# Patient Record
Sex: Male | Born: 1950 | Race: White | Hispanic: No | Marital: Married | Smoking: Current every day smoker
Health system: Southern US, Community
[De-identification: ages and names within clinical notes are randomized; demographics above are authoritative.]

## PROBLEM LIST (undated history)

## (undated) DIAGNOSIS — M1990 Unspecified osteoarthritis, unspecified site: Secondary | ICD-10-CM

## (undated) DIAGNOSIS — K5792 Diverticulitis of intestine, part unspecified, without perforation or abscess without bleeding: Secondary | ICD-10-CM

## (undated) DIAGNOSIS — D490 Neoplasm of unspecified behavior of digestive system: Secondary | ICD-10-CM

## (undated) HISTORY — PX: TONSILLECTOMY: SUR1361

## (undated) HISTORY — DX: Diverticulitis of intestine, part unspecified, without perforation or abscess without bleeding: K57.92

## (undated) HISTORY — PX: CERVICAL SPINE SURGERY: SHX589

## (undated) HISTORY — DX: Neoplasm of unspecified behavior of digestive system: D49.0

---

## 2004-09-27 ENCOUNTER — Ambulatory Visit (HOSPITAL_COMMUNITY): Admission: RE | Admit: 2004-09-27 | Discharge: 2004-09-27 | Payer: Self-pay | Admitting: Neurosurgery

## 2004-11-05 ENCOUNTER — Encounter: Admission: RE | Admit: 2004-11-05 | Discharge: 2004-11-05 | Payer: Self-pay | Admitting: Neurosurgery

## 2010-02-01 ENCOUNTER — Encounter
Admission: RE | Admit: 2010-02-01 | Discharge: 2010-02-01 | Payer: Self-pay | Source: Home / Self Care | Attending: Emergency Medicine | Admitting: Emergency Medicine

## 2010-05-31 NOTE — Op Note (Signed)
NAMEDEQUINCY, BORN                 ACCOUNT NO.:  [id number]   MEDICAL RECORD NO.:  [id number]          PATIENT TYPE:  AMB   LOCATION:  SDS                          FACILITY:  [facility]   PHYSICIAN:  Donalee Citrin, M.D.        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   OPERATION/PROCEDURE:  Anterior cervical diskectomy and fusion of C5-6 and C6-  7 using 5 mm LifeNet patellar wedges and a 40 mm Atlantis Vision plate with  six 13-mm variable angle screws.   SURGEON:  Donalee Citrin, M.D.   ASSISTANT:  Thayer Ohm.   ANESTHESIA:  General endotracheal anesthesia.   HISTORY OF PRESENT ILLNESS:  The patient is a very pleasant 60 year old  gentleman who has a longstanding neck and right arm pain with pain that  radiates from his neck down the back of his right arm to his thumb and  forefinger as well as occasionally into his middle finger where he had  preoperative weakness of his triceps, and preoperative imaging showed severe  spinal stenosis at C5-6 and C6-7 with spondylytic compression of both C6 and  C7 nerve roots.  The patient also had co-existing carpal tunnel confirmed by  EMG with severe median nerve entrapment.  The patient failed all forms of  conservative treatment and was recommended anterior cervical diskectomy and  fusion, to be followed by carpal tunnel release.  This is the operative note  for the anterior cervical diskectomy and fusion.   Risks and benefits explained to patient.  He understood and agreed to  proceed forward.   DESCRIPTION OF PROCEDURE:  The patient was brought to the OR where he  received general anesthesia, in the supine position, neck flexion and  extension, five pounds of halter traction.  The  right side of his neck was  prepped and draped in the usual sterile fashion.  We localized the C5-6 disk  space.  A curvilinear incision was made just off the midline and extended to  the sternocleidomastoid.  Then a superficial layer of platysma was dissected  out and divided longitudinally.  The avascular plane of the  sternocleidomastoid and the strap muscle was developed down to prevertebral  fascia.  The prevertebral fascia was dissected with Kitners.  Intraoperative  x-ray confirmed localization of the C5-6 disk space.  Annulotomy was made  with an 11 blade scalpel, marked the disk space and the longus coli was  reflected laterally with Bovie electrocautery and bipolar electrocautery.  Then both disk spaces were identified and incised.  Self-retaining retractor  was placed.  Annulotomies were extended with the 15-blade scalpel. There was  noted to be marked osteophytosis in the front anterior margin to C5-6 and C6-  7 disk space.  These were all bitten off with the Leksell rongeur and 2 and  3-mm Kerrison punch.  Then both interspaces were drilled down to posterior  annulus and posterior osteophytic complex.  At this point to operating  microscope was draped and brought into the field under microscopic  illumination.  The C5-6 disk space was drilled down.  There was noted to be  a very large osteophyte coming off the C5 vertebral body, compressing the  thecal sac and spinal cord at this level.  Using a combination of a 1-mm  Kerrison punch and high-speed drill, this osteophyte was drilled down and  bitten off, decompressing the central canal, and the posterior osteophytic  ligament was identified and removed in piecemeal fashion, identifying the  thecal sac.  Then the large osteophyte coming off the C5 as well as C6  vertebral bodies were underbitten and decompressing both proximal C6 neural  foramina. The right side C6 nerve root was mildly decompressed at the end of  diskectomy as  well as the left and there was no further stenosis.  __________ bone graft and Gelfoam was placed.   Then at C6-7 the procedure was repeated.  Again a large osteophyte coming  off the C6 and C7 vertebral bodies compressing the right paramedian aspect  of the spinal cord thecal sac.  This was all underbitten with a 2-mm  Kerrison punch.  Posterior osteophytic ligament was removed in piecemeal  fashion.  Thecal sac was immediately visualized and this large osteophyte  causing severe spinal stenosis at this level was underbitten until the  thecal sac and dura bulged back up into the diskectomy defect in anatomic  position.  Then both the C7 neural foramina were decompressed proximally and  the wound was copiously irrigated, again placing __________ bone graft.  Two  5 mm wedges were inserted, 1-mm deep to the anterior vertebral body line.  A  40-mm Atlantis plate was sized, selected and inserted.  Six 13-mm variable  angle screws were drill tapped in place.  All screws had excellent purchase.  Set screws tightened down.  The wound was then copiously irrigated.  Meticulous hemostasis was maintained.  The platysma was reapproximated with  3-0 interrupted Vicryl and the skin was closed with running 4-0  subcuticular.  Benzoin and Steri-Strips applied.  The patient went to the  recovery room in stable condition.           ______________________________  Donalee Citrin, M.D.     GC/MEDQ  D:  09/27/2004  T:  09/27/2004  Job:  [id number]

## 2010-05-31 NOTE — Op Note (Signed)
Roger Mcdonald, Roger Mcdonald                 ACCOUNT NO.:  [id number]   MEDICAL RECORD NO.:  [id number]          PATIENT TYPE:  AMB   LOCATION:  SDS                          FACILITY:  [facility]   PHYSICIAN:  Donalee Citrin, M.D.        DATE OF BIRTH:  March 24, 1950   DATE OF PROCEDURE:  09/27/2004  DATE OF DISCHARGE:                                 OPERATIVE REPORT   PREOPERATIVE DIAGNOSIS:  Right carpal tunnel syndrome.   PROCEDURE:  Right carpal tunnel release.   SURGEON:  Donalee Citrin, M.D.   ANESTHESIA:  General endotracheal.   INDICATIONS FOR PROCEDURE:  The patient is a very pleasant 60 year old  gentleman who had both neck and right arm pain, as well as severe right  carpal tunnel syndrome documented by EMG.  The patient had undergone an  anterior cervical diskectomy fusion by me prior to turning the table and  undergoing a carpal tunnel release.  This was dictated in the previous  operative note.   DESCRIPTION OF PROCEDURE:  At the completion of the anterior cervical  diskectomy, the bed was turned and the right hand was prepped and draped in  the usual sterile fashion.  An incision was drawn out along the palmar  crease proximally 3 to 4 cm long, into the distal wrist crease and was  incised with a #15 blade scalpel.  Then using sharp dissection with the #15  blade scalpel, the palmar fascia was dissected away, exposing the transverse  carpal ligament which was incised, noting marked hypertrophy. The under-  surface of the transverse carpal ligament was freed up with a mosquito  hemostat.  Then using a mosquito to develop the plane between the transverse  carpal ligament and the median nerve, the transverse carpal ligament was  divided.  This was divided distally, freeing up the distal median nerve as  well as proximally up towards the wrist and proximal forearm.  This was  divided there as well, and completely freeing up the median nerve.  At the  end of the division of the ligament, the  median nerve was free and clear and  explored with a hemostat.  The wound was then copiously irrigated.  Meticulous hemostasis was maintained.  Interrupted vertical mattress suture  was then placed in the skin and the hand was dressed with Bacitracin,  Adaptic, fluffs and an Ace wrap.           ______________________________  Donalee Citrin, M.D.     GC/MEDQ  D:  09/27/2004  T:  09/27/2004  Job:  [id number]

## 2012-02-04 IMAGING — CR DG HAND COMPLETE 3+V*R*
3 series · 3 of 3 positions shown · non-contrast
Comparison: None

CLINICAL DATA: right hand third metacarpal tenderness and swelling

RIGHT HAND - COMPLETE 3+ VIEW

[view not recorded (1 of 3)]
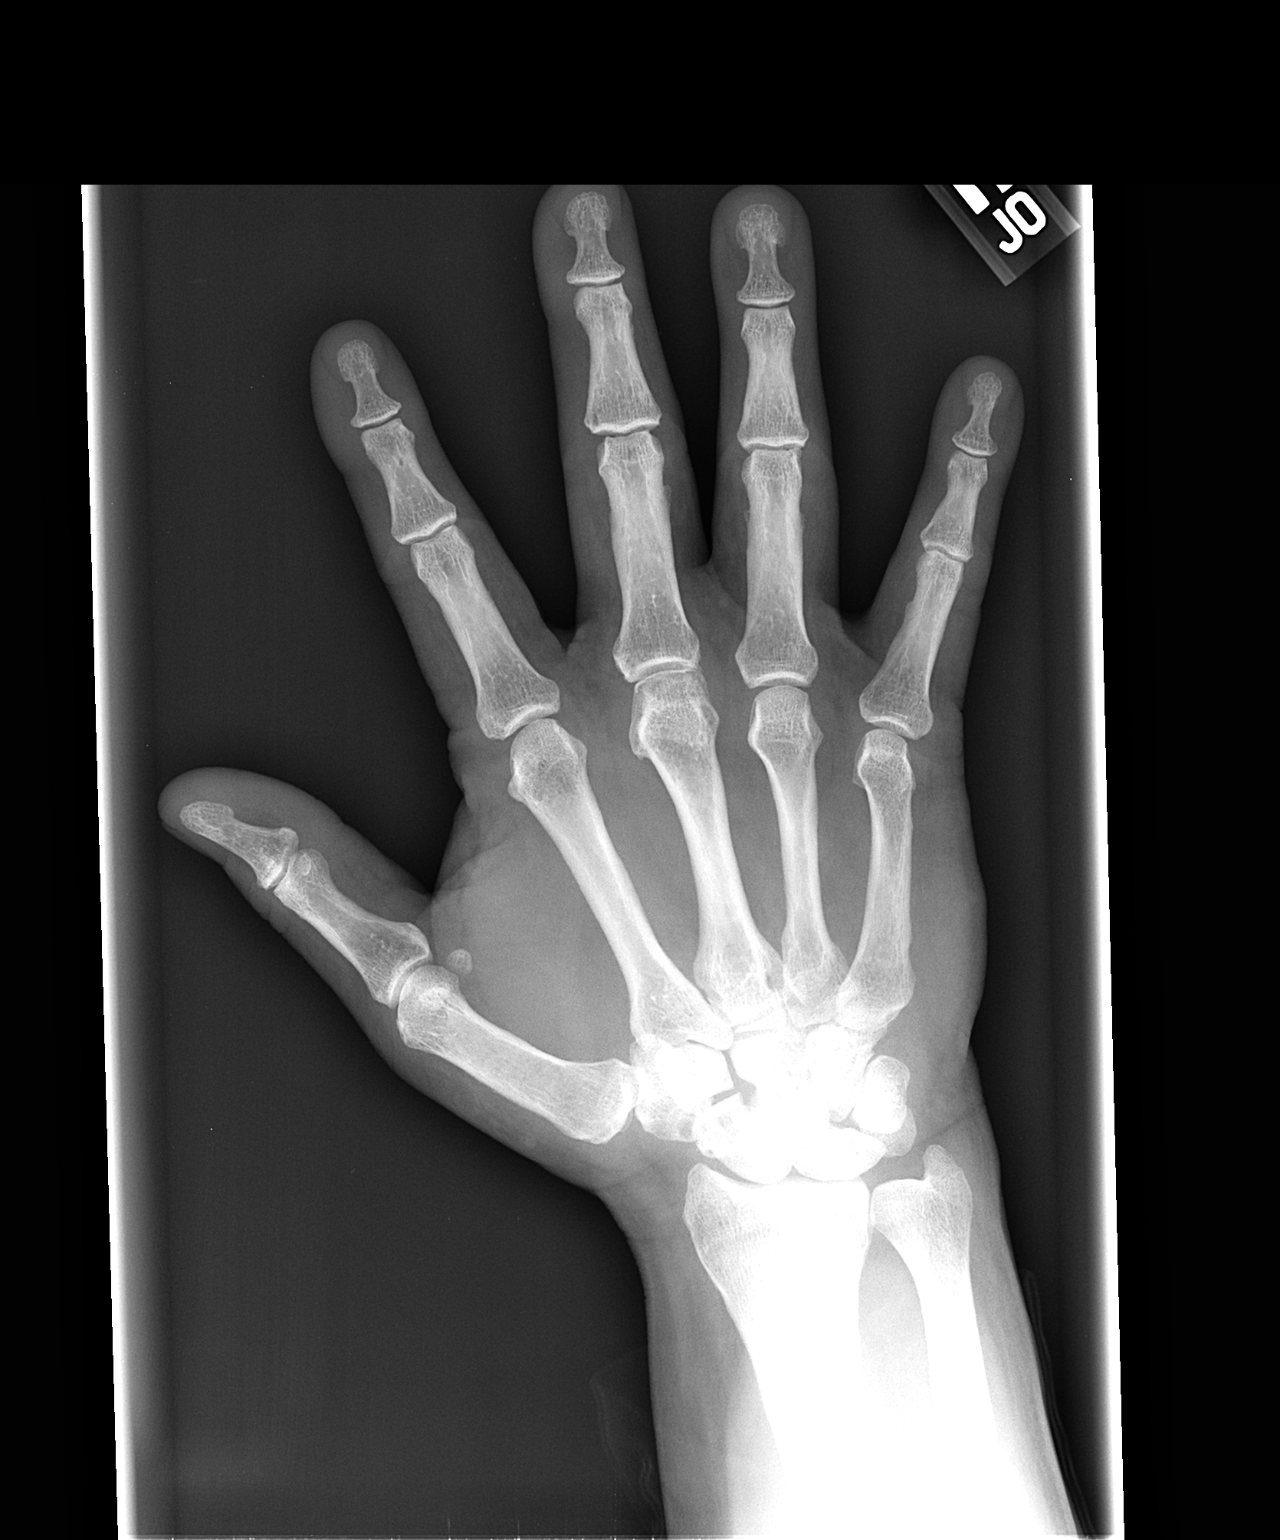

[view not recorded (2 of 3)]
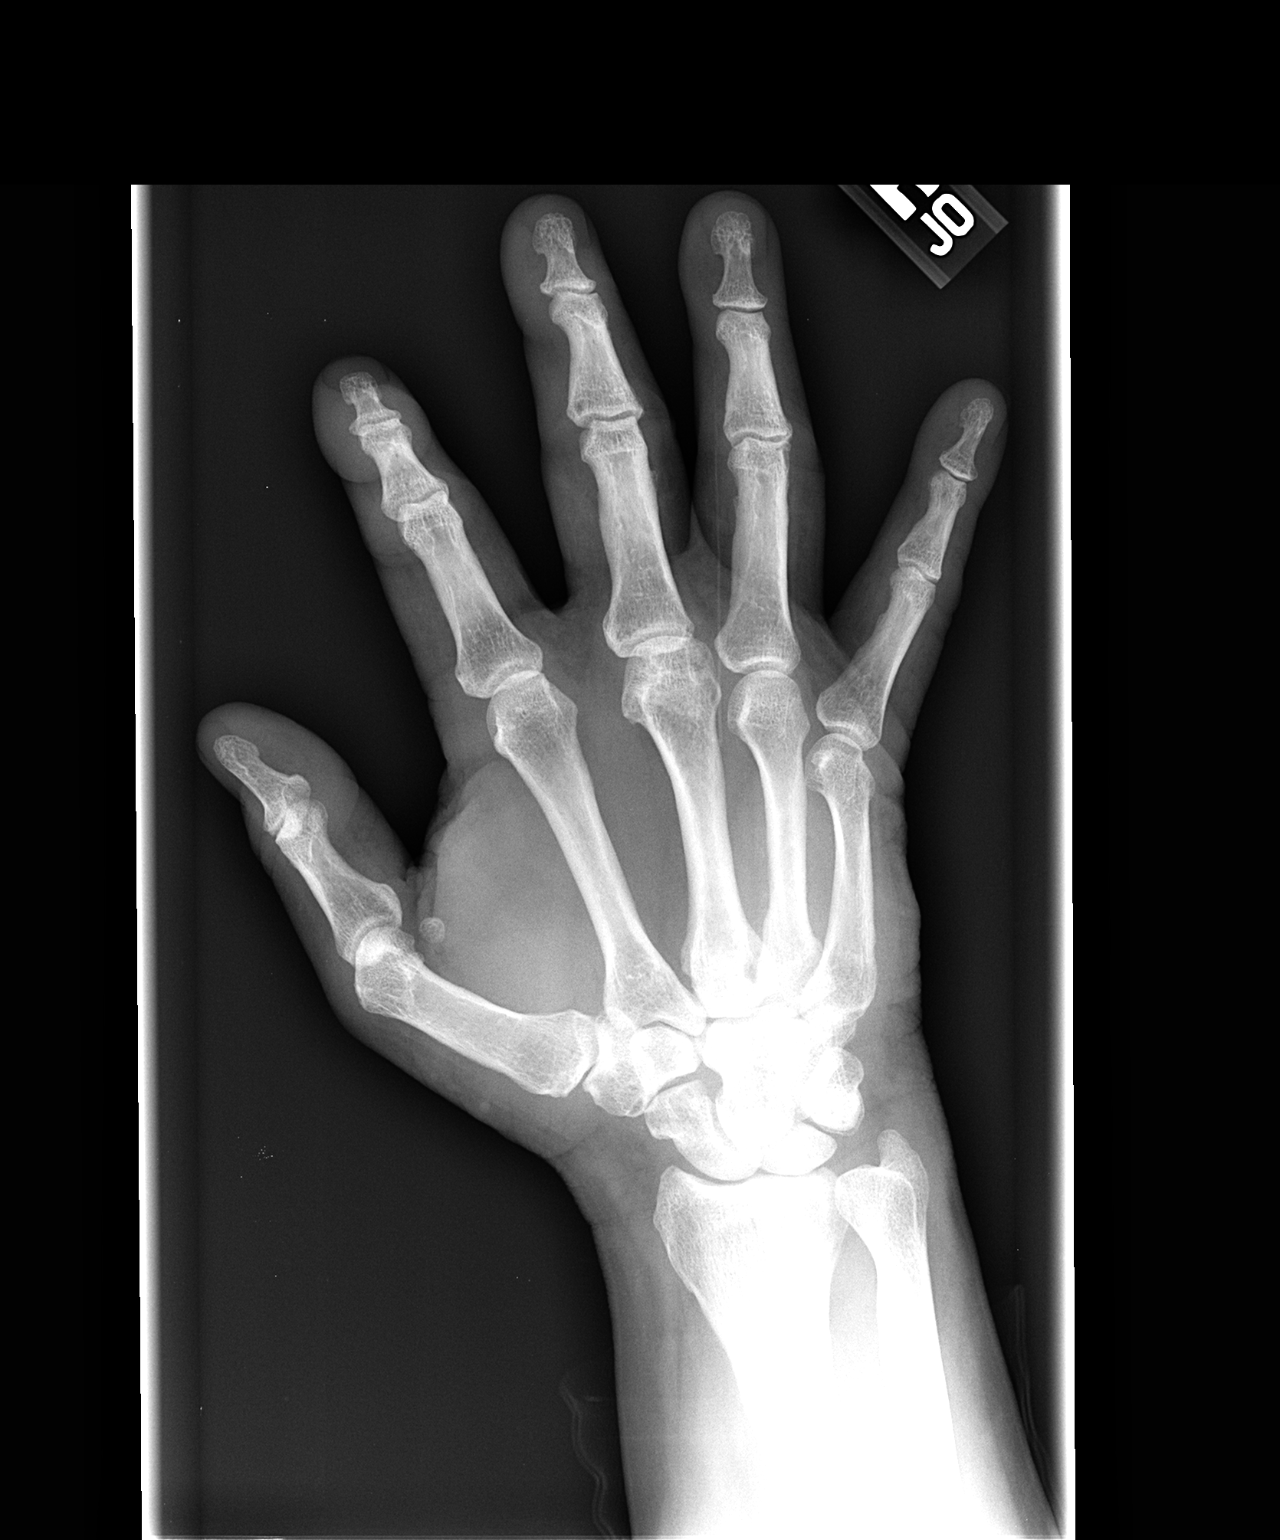

[view not recorded (3 of 3)]
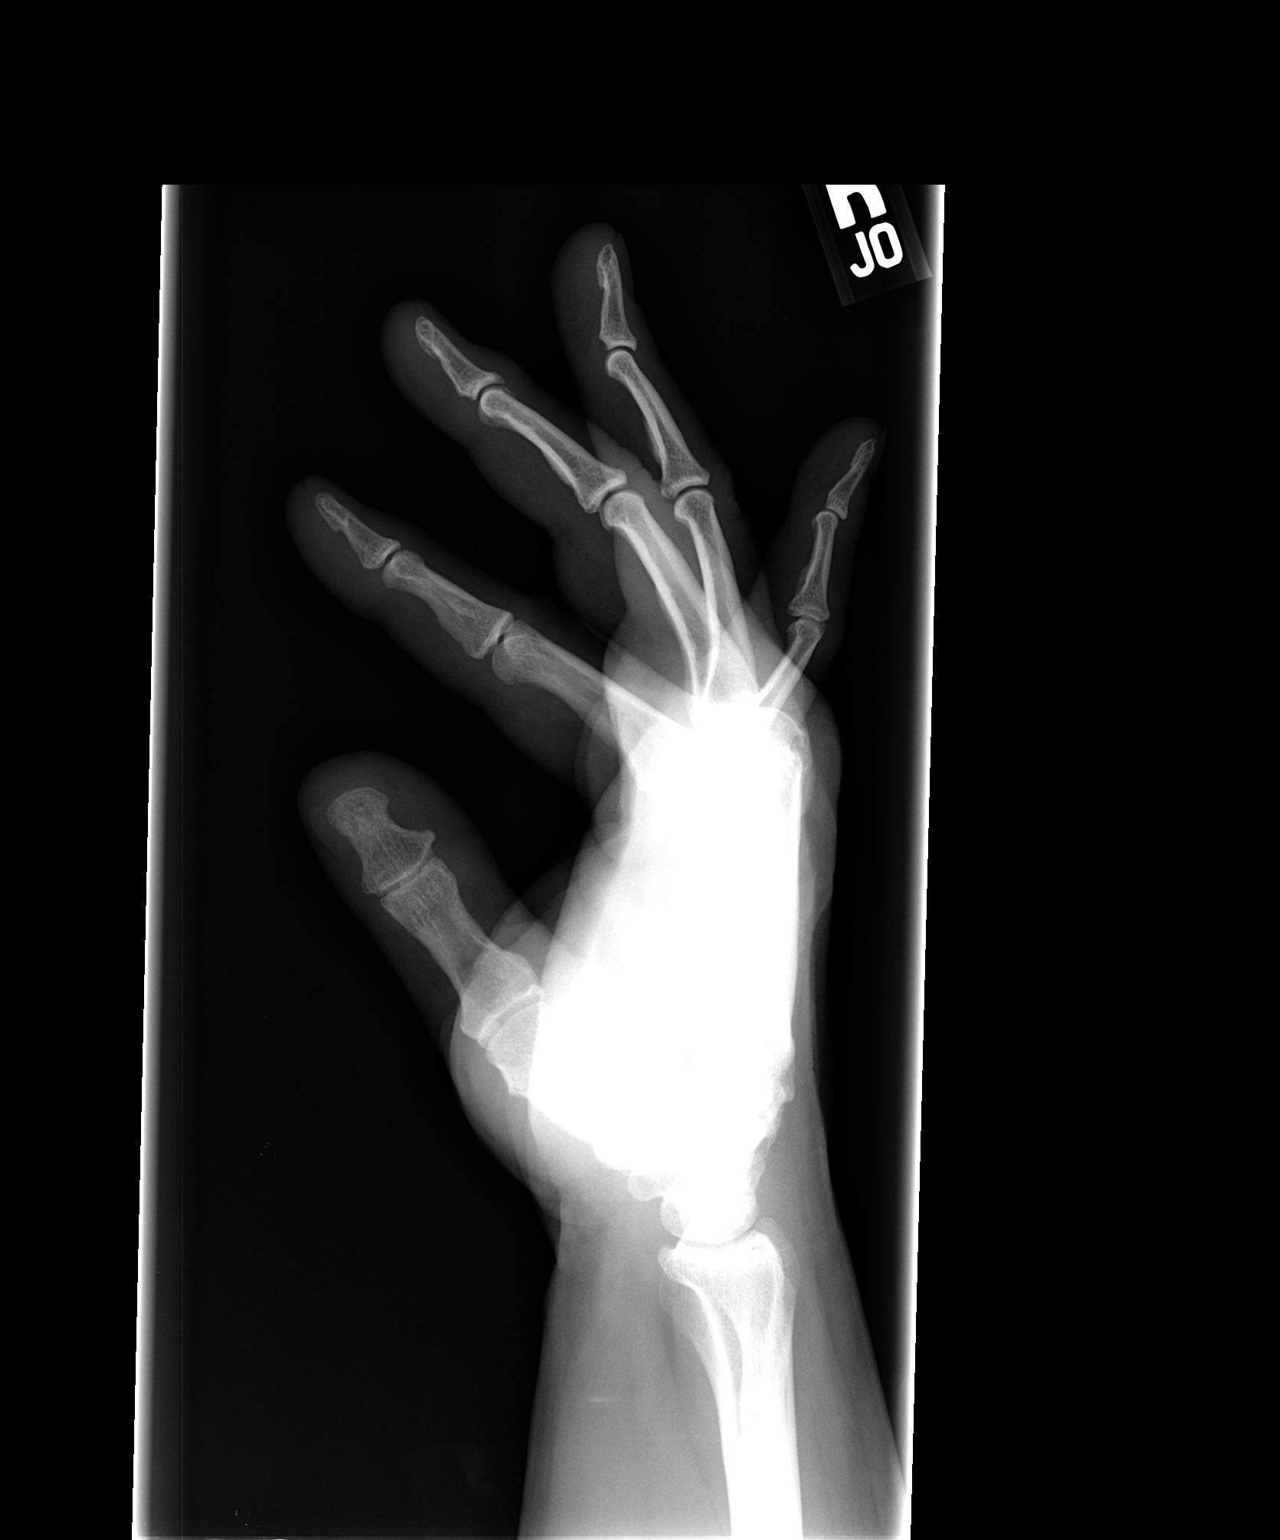

[3 of 3 positions shown; findings below may reference images not displayed]

FINDINGS: There is no evidence of fracture or dislocation.  There
is no evidence of arthropathy or other focal bone abnormality.
Soft tissues are unremarkable.
IMPRESSION: No focal bony abnormalities noted.

## 2022-06-08 ENCOUNTER — Encounter (HOSPITAL_BASED_OUTPATIENT_CLINIC_OR_DEPARTMENT_OTHER): Payer: Self-pay | Admitting: Emergency Medicine

## 2022-06-08 ENCOUNTER — Inpatient Hospital Stay (HOSPITAL_BASED_OUTPATIENT_CLINIC_OR_DEPARTMENT_OTHER)
Admission: EM | Admit: 2022-06-08 | Discharge: 2022-06-13 | DRG: 331 | Disposition: A | Discharge status: Home Health | Payer: Medicare Other | Attending: Internal Medicine | Admitting: Internal Medicine

## 2022-06-08 ENCOUNTER — Other Ambulatory Visit: Payer: Self-pay

## 2022-06-08 ENCOUNTER — Emergency Department (HOSPITAL_BASED_OUTPATIENT_CLINIC_OR_DEPARTMENT_OTHER): Payer: Medicare Other

## 2022-06-08 DIAGNOSIS — K56609 Unspecified intestinal obstruction, unspecified as to partial versus complete obstruction: Secondary | ICD-10-CM

## 2022-06-08 DIAGNOSIS — I700 Atherosclerosis of aorta: Secondary | ICD-10-CM | POA: Diagnosis present

## 2022-06-08 DIAGNOSIS — Z6834 Body mass index (BMI) 34.0-34.9, adult: Secondary | ICD-10-CM | POA: Diagnosis not present

## 2022-06-08 DIAGNOSIS — K5720 Diverticulitis of large intestine with perforation and abscess without bleeding: Secondary | ICD-10-CM | POA: Diagnosis present

## 2022-06-08 DIAGNOSIS — Z66 Do not resuscitate: Secondary | ICD-10-CM | POA: Diagnosis present

## 2022-06-08 DIAGNOSIS — R109 Unspecified abdominal pain: Secondary | ICD-10-CM | POA: Diagnosis present

## 2022-06-08 DIAGNOSIS — C187 Malignant neoplasm of sigmoid colon: Secondary | ICD-10-CM | POA: Diagnosis not present

## 2022-06-08 DIAGNOSIS — F17210 Nicotine dependence, cigarettes, uncomplicated: Secondary | ICD-10-CM | POA: Diagnosis present

## 2022-06-08 DIAGNOSIS — K6389 Other specified diseases of intestine: Principal | ICD-10-CM

## 2022-06-08 DIAGNOSIS — D72829 Elevated white blood cell count, unspecified: Secondary | ICD-10-CM | POA: Diagnosis not present

## 2022-06-08 DIAGNOSIS — K56601 Complete intestinal obstruction, unspecified as to cause: Secondary | ICD-10-CM

## 2022-06-08 DIAGNOSIS — E876 Hypokalemia: Secondary | ICD-10-CM | POA: Diagnosis not present

## 2022-06-08 LAB — URINALYSIS, ROUTINE W REFLEX MICROSCOPIC
Glucose, UA: NEGATIVE mg/dL
Hgb urine dipstick: NEGATIVE
Ketones, ur: NEGATIVE mg/dL
Leukocytes,Ua: NEGATIVE
Nitrite: NEGATIVE
Protein, ur: 30 mg/dL — AB
Specific Gravity, Urine: >=1.03 (ref 1.005–1.030)
pH: 5.5 (ref 5.0–8.0)

## 2022-06-08 LAB — CBC WITH DIFFERENTIAL/PLATELET
Abs Immature Granulocytes: 0.04 10*3/uL (ref 0.00–0.07)
Basophils Absolute: 0.1 10*3/uL (ref 0.0–0.1)
Basophils Relative: 1 %
Eosinophils Absolute: 0.4 10*3/uL (ref 0.0–0.5)
Eosinophils Relative: 4 %
HCT: 44.9 % (ref 39.0–52.0)
Hemoglobin: 15 g/dL (ref 13.0–17.0)
Immature Granulocytes: 0 %
Lymphocytes Relative: 14 %
Lymphs Abs: 1.6 10*3/uL (ref 0.7–4.0)
MCH: 29.4 pg (ref 26.0–34.0)
MCHC: 33.4 g/dL (ref 30.0–36.0)
MCV: 88 fL (ref 80.0–100.0)
Monocytes Absolute: 1 10*3/uL (ref 0.1–1.0)
Monocytes Relative: 9 %
Neutro Abs: 8.3 10*3/uL — ABNORMAL HIGH (ref 1.7–7.7)
Neutrophils Relative %: 72 %
Platelets: 299 10*3/uL (ref 150–400)
RBC: 5.1 MIL/uL (ref 4.22–5.81)
RDW: 13.1 % (ref 11.5–15.5)
WBC: 11.5 10*3/uL — ABNORMAL HIGH (ref 4.0–10.5)
nRBC: 0 % (ref 0.0–0.2)

## 2022-06-08 LAB — COMPREHENSIVE METABOLIC PANEL
ALT: 19 U/L (ref 0–44)
AST: 20 U/L (ref 15–41)
Albumin: 3.4 g/dL — ABNORMAL LOW (ref 3.5–5.0)
Alkaline Phosphatase: 55 U/L (ref 38–126)
Anion gap: 8 (ref 5–15)
BUN: 13 mg/dL (ref 8–23)
CO2: 23 mmol/L (ref 22–32)
Calcium: 8 mg/dL — ABNORMAL LOW (ref 8.9–10.3)
Chloride: 105 mmol/L (ref 98–111)
Creatinine, Ser: 1 mg/dL (ref 0.61–1.24)
GFR, Estimated: >60 mL/min (ref >60)
Glucose, Bld: 114 mg/dL — ABNORMAL HIGH (ref 70–99)
Potassium: 3.6 mmol/L (ref 3.5–5.1)
Sodium: 136 mmol/L (ref 135–145)
Total Bilirubin: 0.8 mg/dL (ref 0.3–1.2)
Total Protein: 6.7 g/dL (ref 6.5–8.1)

## 2022-06-08 LAB — URINALYSIS, MICROSCOPIC (REFLEX)

## 2022-06-08 LAB — LIPASE, BLOOD: Lipase: 31 U/L (ref 11–51)

## 2022-06-08 MED ORDER — NALOXONE HCL 0.4 MG/ML IJ SOLN: 0.4000 mg | INTRAMUSCULAR | Status: DC | PRN

## 2022-06-08 MED ORDER — SODIUM CHLORIDE 0.9 % IV BOLUS
1000.0000 mL | Freq: Once | INTRAVENOUS | Status: AC
  Administered 2022-06-08: 1000 mL via INTRAVENOUS

## 2022-06-08 MED ORDER — FENTANYL CITRATE PF 50 MCG/ML IJ SOSY: 50.0000 ug | PREFILLED_SYRINGE | Freq: Once | INTRAMUSCULAR | Status: DC

## 2022-06-08 MED ORDER — ACETAMINOPHEN 325 MG PO TABS: 650.0000 mg | ORAL_TABLET | Freq: Four times a day (QID) | ORAL | Status: DC | PRN

## 2022-06-08 MED ORDER — ONDANSETRON HCL 4 MG/2ML IJ SOLN
4.0000 mg | Freq: Once | INTRAMUSCULAR | Status: DC
  Filled 2022-06-08: qty 2

## 2022-06-08 MED ORDER — ACETAMINOPHEN 500 MG PO TABS
1000.0000 mg | ORAL_TABLET | Freq: Once | ORAL | Status: AC
  Administered 2022-06-08: 1000 mg via ORAL
  Filled 2022-06-08: qty 2

## 2022-06-08 MED ORDER — SODIUM CHLORIDE 0.9 % IV SOLN: INTRAVENOUS | Status: AC

## 2022-06-08 MED ORDER — ACETAMINOPHEN 650 MG RE SUPP: 650.0000 mg | Freq: Four times a day (QID) | RECTAL | Status: DC | PRN

## 2022-06-08 MED ORDER — LIDOCAINE HCL URETHRAL/MUCOSAL 2 % EX GEL
1.0000 | Freq: Once | CUTANEOUS | Status: AC
  Administered 2022-06-08: 1 via TOPICAL
  Filled 2022-06-08: qty 11

## 2022-06-08 MED ORDER — MORPHINE SULFATE (PF) 2 MG/ML IV SOLN: 0.5000 mg | INTRAVENOUS | Status: DC | PRN

## 2022-06-08 MED ORDER — IOHEXOL 300 MG/ML  SOLN
100.0000 mL | Freq: Once | INTRAMUSCULAR | Status: AC | PRN
  Administered 2022-06-08: 100 mL via INTRAVENOUS

## 2022-06-08 MED ORDER — ACETAMINOPHEN 325 MG PO TABS: 325.0000 mg | ORAL_TABLET | Freq: Once | ORAL | Status: DC

## 2022-06-08 MED ORDER — ONDANSETRON HCL 4 MG/2ML IJ SOLN: 4.0000 mg | Freq: Four times a day (QID) | INTRAMUSCULAR | Status: DC | PRN

## 2022-06-08 NOTE — ED Notes (Signed)
ED TO INPATIENT HANDOFF REPORT  ED Nurse Name and Phone #: Gorden Harms [phone]  S Name/Age/Gender Roger Mcdonald 72 y.o. male Room/Bed: MH08/MH08  Code Status   Code Status: Not on file  Home/SNF/Other Home Patient oriented to: self, place, time, and situation Is this baseline? Yes   Triage Complete: Triage complete  Chief Complaint Large bowel obstruction (HCC) [K56.609]  Triage Note Pt c/o general lower abd pain; sts he has had a lot of gas for about a month; this pain is worse; +diarrhea   Allergies No Known Allergies  Level of Care/Admitting Diagnosis ED Disposition     ED Disposition  Admit   Condition  --   Comment  Hospital [facility] [[id number]]  Level of Care: Med-Surg [16]  May admit patient to [facility] or [facility] if equivalent level of care is available:: Yes  Interfacility transfer: Yes  Covid Evaluation: Asymptomatic - no recent exposure (last 10 days) testing not required  Diagnosis: Large bowel obstruction [facility]) [[id number]]  Admitting Physician: Frankey Shown [[id number]]  Attending Physician: Frankey Shown [[id number]]  Certification:: I certify this patient will need inpatient services for at least 2 midnights  Estimated Length of Stay: 3          B Medical/Surgery History History reviewed. No pertinent past medical history. Past Surgical History:  Procedure Laterality Date   CERVICAL SPINE SURGERY     TONSILLECTOMY       A IV Location/Drains/Wounds Patient Lines/Drains/Airways Status     Active Line/Drains/Airways     Name Placement date Placement time Site Days   Peripheral IV 06/08/22 20 G Posterior;Right Hand 06/08/22  1550  Hand  less than 1   Peripheral IV 06/08/22 18 G Left Antecubital 06/08/22  1640  Antecubital  less than 1   NG/OG Vented/Dual Lumen 14 Fr. Left nare External length of tube 22 cm 06/08/22  1852  Left nare  less than 1            Intake/Output Last 24  hours  Intake/Output Summary (Last 24 hours) at 06/08/2022 2007 Last data filed at 06/08/2022 1727 Gross per 24 hour  Intake 1000 ml  Output --  Net 1000 ml    Labs/Imaging Results for orders placed or performed during the hospital encounter of 06/08/22 (from the past 48 hour(s))  CBC with Differential     Status: Abnormal   Collection Time: 06/08/22  3:56 PM  Result Value Ref Range   WBC 11.5 (H) 4.0 - 10.5 K/uL   RBC 5.10 4.22 - 5.81 MIL/uL   Hemoglobin 15.0 13.0 - 17.0 g/dL   HCT 29.5 18.8 - 41.6 %   MCV 88.0 80.0 - 100.0 fL   MCH 29.4 26.0 - 34.0 pg   MCHC 33.4 30.0 - 36.0 g/dL   RDW 60.6 30.1 - 60.1 %   Platelets 299 150 - 400 K/uL   nRBC 0.0 0.0 - 0.2 %   Neutrophils Relative % 72 %   Neutro Abs 8.3 (H) 1.7 - 7.7 K/uL   Lymphocytes Relative 14 %   Lymphs Abs 1.6 0.7 - 4.0 K/uL   Monocytes Relative 9 %   Monocytes Absolute 1.0 0.1 - 1.0 K/uL   Eosinophils Relative 4 %   Eosinophils Absolute 0.4 0.0 - 0.5 K/uL   Basophils Relative 1 %   Basophils Absolute 0.1 0.0 - 0.1 K/uL   Immature Granulocytes 0 %   Abs Immature Granulocytes 0.04 0.00 - 0.07 K/uL  Comment: Performed at [facility], [address] [place], [place] [address]  Urinalysis, Routine w reflex microscopic -Urine, Clean Catch     Status: Abnormal   Collection Time: 06/08/22  3:57 PM  Result Value Ref Range   Color, Urine YELLOW YELLOW   APPearance CLEAR CLEAR   Specific Gravity, Urine >=1.030 1.005 - 1.030   pH 5.5 5.0 - 8.0   Glucose, UA NEGATIVE NEGATIVE mg/dL   Hgb urine dipstick NEGATIVE NEGATIVE   Bilirubin Urine SMALL (A) NEGATIVE   Ketones, ur NEGATIVE NEGATIVE mg/dL   Protein, ur 30 (A) NEGATIVE mg/dL   Nitrite NEGATIVE NEGATIVE   Leukocytes,Ua NEGATIVE NEGATIVE    Comment: Performed at [facility], [address] [place], [place] [address]  Urinalysis, Microscopic (reflex)     Status: Abnormal   Collection Time: 06/08/22  3:57 PM  Result Value Ref Range   RBC /  HPF 0-5 0 - 5 RBC/hpf   WBC, UA 0-5 0 - 5 WBC/hpf   Bacteria, UA RARE (A) NONE SEEN   Squamous Epithelial / HPF 0-5 0 - 5 /HPF   Mucus PRESENT     Comment: Performed at [facility], [address] [place], [place] [address]  Comprehensive metabolic panel     Status: Abnormal   Collection Time: 06/08/22  4:40 PM  Result Value Ref Range   Sodium 136 135 - 145 mmol/L   Potassium 3.6 3.5 - 5.1 mmol/L   Chloride 105 98 - 111 mmol/L   CO2 23 22 - 32 mmol/L   Glucose, Bld 114 (H) 70 - 99 mg/dL    Comment: Glucose reference range applies only to samples taken after fasting for at least 8 hours.   BUN 13 8 - 23 mg/dL   Creatinine, Ser 9.14 0.61 - 1.24 mg/dL   Calcium 8.0 (L) 8.9 - 10.3 mg/dL   Total Protein 6.7 6.5 - 8.1 g/dL   Albumin 3.4 (L) 3.5 - 5.0 g/dL   AST 20 15 - 41 U/L   ALT 19 0 - 44 U/L   Alkaline Phosphatase 55 38 - 126 U/L   Total Bilirubin 0.8 0.3 - 1.2 mg/dL   GFR, Estimated >78 >29 mL/min    Comment: (NOTE) Calculated using the CKD-EPI Creatinine Equation (2021)    Anion gap 8 5 - 15    Comment: Performed at [facility], [address] [place], [place] [address]  Lipase, blood     Status: None   Collection Time: 06/08/22  4:40 PM  Result Value Ref Range   Lipase 31 11 - 51 U/L    Comment: Performed at [facility], [address] [place], [place] [address]   DG Abd Portable 1V  Result Date: 06/08/2022 CLINICAL DATA:  NG tube placement EXAM: PORTABLE ABDOMEN - 1 VIEW COMPARISON:  CT today FINDINGS: NG tube tip is in the proximal stomach near the GE junction. Side port is in the distal esophagus. Gaseous distention of the visualized colon. Visualized lung bases clear. IMPRESSION: NG tube tip near the GE junction with the side port in the distal esophagus. This could be advanced several cm for optimal positioning. Electronically Signed   By: Charlett Nose M.D.   On: 06/08/2022 19:21   CT ABDOMEN PELVIS W CONTRAST  Result Date:  06/08/2022 CLINICAL DATA:  Abdominal pain, acute, nonlocalized EXAM: CT ABDOMEN AND PELVIS WITH CONTRAST TECHNIQUE: Multidetector CT imaging  of the abdomen and pelvis was performed using the standard protocol following bolus administration of intravenous contrast. RADIATION DOSE REDUCTION: This exam was performed according to the departmental dose-optimization program which includes automated exposure control, adjustment of the mA and/or kV according to patient size and/or use of iterative reconstruction technique. 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 Small hiatal hernia. Hepatobiliary: Stable scattered subcentimeter and pericentimeter hypodense hepatic lesions with largest measuring 2.2 x 1.7 cm with a density of 6 Hounsfield units. These likely represent simple hepatic cysts. No gallstones, gallbladder wall thickening, or pericholecystic fluid. No biliary dilatation. Pancreas: No focal lesion. Normal pancreatic contour. No surrounding inflammatory changes. No main pancreatic ductal dilatation. Spleen: Normal in size without focal abnormality. Adrenals/Urinary Tract: No adrenal nodule bilaterally. Bilateral kidneys enhance symmetrically. Punctate calcification within the left kidney. No ureterolithiasis. No right nephrolithiasis. No hydronephrosis. No hydroureter. The urinary bladder is unremarkable. On delayed imaging, there is no urothelial wall thickening and there are no filling defects in the opacified portions of the bilateral collecting systems or ureters. Stomach/Bowel: Stomach is within normal limits. Irregular bowel wall thickening of the sigmoid colon extending approximately 9 cm in the craniocaudal dimension with associated gaseous and liquid stool dilatation of the proximal large bowel up to a caliber of 9 cm. No evidence of small bowel  wall thickening or dilatation. Appendix appears normal. Vascular/Lymphatic: No abdominal aorta or iliac aneurysm. Severe atherosclerotic plaque of the aorta and its branches. Multiple prominent but non-enlarged pericolonic lymph nodes along the rectum and sigmoid colon (2:65). No abdominal or inguinal lymphadenopathy. Reproductive: Prominent prostate measuring up to 4.6 cm Other: No intraperitoneal free fluid. No intraperitoneal free gas. No organized fluid collection. Musculoskeletal: No abdominal wall hernia or abnormality. No suspicious lytic or blastic osseous lesions. No acute displaced fracture. Multilevel degenerative changes of the spine. IMPRESSION: 1. Obstructive sigmoid mass extending approximately 9 cm in the craniocaudal dimension. Findings suggestive of malignancy. 2. High-grade large bowel obstruction with no findings of perforation. Recommend surgical consultation. 3. Multiple prominent but non-enlarged pericolonic lymph nodes along the rectum and sigmoid colon. Recommend attention on follow-up. 4. Colonic diverticulosis with no acute diverticulitis. 5. Nonobstructive punctate right nephrolithiasis. 6. Prominent prostate. 7. Small hiatal hernia. 8.  Aortic Atherosclerosis (ICD10-I70.0). Electronically Signed   By: Tish Frederickson M.D.   On: 06/08/2022 18:21    Pending Labs Unresulted Labs (From admission, onward)    None       Vitals/Pain Today's Vitals   06/08/22 1730 06/08/22 1750 06/08/22 1835 06/08/22 1900  BP:  (!) 140/88 (!) 140/111 (!) 155/96  Pulse: 64 65 91 94  Resp:   18   Temp:      TempSrc:      SpO2: 97% 97% 98% 98%  Weight:      Height:      PainSc:        Isolation Precautions No active isolations  Medications Medications  ondansetron (ZOFRAN) injection 4 mg (4 mg Intravenous Not Given 06/08/22 1605)  sodium chloride 0.9 % bolus 1,000 mL (0 mLs Intravenous Stopped 06/08/22 1727)  acetaminophen (TYLENOL) tablet 1,000 mg (1,000 mg Oral Given 06/08/22 1603)   iohexol (OMNIPAQUE) 300 MG/ML solution 100 mL (100 mLs Intravenous Contrast Given 06/08/22 1736)  lidocaine (XYLOCAINE) 2 % jelly 1 Application (1 Application Topical Given 06/08/22 1836)    Mobility walks  Focused Assessments     R Recommendations: See Admitting Provider Note  Report given to: Haskel Schroeder, RN  Additional Notes:

## 2022-06-08 NOTE — ED Triage Notes (Signed)
Pt c/o general lower abd pain; sts he has had a lot of gas for about a month; this pain is worse; +diarrhea

## 2022-06-08 NOTE — Progress Notes (Signed)
Pt has arrived [facility]. He is warm, dry, no visible distress. Reports that he wants the NG tube out. He confirmed not wanting any narcotics for pain control. He is producing small amounts of emesis. Offered to get nausea medication orders. He declined. HOB elevated. NG tube to suction with tan colored drainage. Nurse to notify admitting for [facility], of pt's arrival to room.

## 2022-06-08 NOTE — Progress Notes (Signed)
Paged [facility] admit, in regards to the pt being present in room/unit, at [phone]. Awaiting return call, to unit.

## 2022-06-08 NOTE — Progress Notes (Signed)
[facility] admitting staff called to unit. Informed her of pt being up to room and in room 1315. She stated that she has assigned the pt to Dr. Loney Loh.

## 2022-06-08 NOTE — ED Notes (Signed)
Pt's pocket knife retrieved from security and given to [facility] with [facility].

## 2022-06-08 NOTE — H&P (Signed)
History and Physical    Roger Mcdonald ZOX:[id number] DOB: 07-17-1950 DOA: 06/08/2022  PCP: Pcp, No  Patient coming from: [facility] ED  Chief Complaint: Abdominal pain  HPI: Roger Mcdonald is a 72 y.o. male with no significant past medical history and surgical history of C-spine surgery and tonsillectomy presented to the ED with abdominal pain.  Vital signs stable.  Labs showing WBC 11.5, hemoglobin 15.0, platelet count 299k, sodium 136, potassium 3.6, chloride 105, bicarb 23, BUN 13, creatinine 1.0, glucose 114, calcium 8.0, albumin 3.4, normal lipase and LFTs, UA not suggestive of infection.  CT abdomen pelvis with contrast showing: "IMPRESSION: 1. Obstructive sigmoid mass extending approximately 9 cm in the craniocaudal dimension. Findings suggestive of malignancy. 2. High-grade large bowel obstruction with no findings of perforation. Recommend surgical consultation. 3. Multiple prominent but non-enlarged pericolonic lymph nodes along the rectum and sigmoid colon. Recommend attention on follow-up. 4. Colonic diverticulosis with no acute diverticulitis. 5. Nonobstructive punctate right nephrolithiasis. 6. Prominent prostate. 7. Small hiatal hernia. 8.  Aortic Atherosclerosis (ICD10-I70.0)."  General surgery (Dr. Maisie Fus) was consulted and NG tube placed.  Patient received Tylenol and 1 L normal saline bolus in the ED.  Patient states he is having abdominal pain and distention for several days.  Also having some diarrhea which is nonbloody.  He has felt nauseous but has not vomited.  He denies history of prior bowel obstruction.  States he had 2 prior colonoscopies done at [facility] in [facility] and does not remember when he had his last colonoscopy but states he was told that it was showing "something borderline not cancerous."  He denies unintentional weight loss.  Denies chest pain or shortness of breath.  Review of Systems:  Review of Systems  All other systems reviewed and  are negative.   History reviewed. No pertinent past medical history.  Past Surgical History:  Procedure Laterality Date   CERVICAL SPINE SURGERY     TONSILLECTOMY       reports that he has been smoking cigarettes. He has never used smokeless tobacco. He reports that he does not currently use alcohol. He reports that he does not currently use drugs.  No Known Allergies  History reviewed. No pertinent family history.  Prior to Admission medications   Not on File    Physical Exam: Vitals:   06/08/22 1750 06/08/22 1835 06/08/22 1900 06/08/22 2100  BP: (!) 140/88 (!) 140/111 (!) 155/96 (!) 144/100  Pulse: 65 91 94 93  Resp:  18  19  Temp:    98 F (36.7 C)  TempSrc:    Oral  SpO2: 97% 98% 98% 98%  Weight:      Height:        Physical Exam Vitals reviewed.  Constitutional:      Comments: Appears uncomfortable secondary to pain  HENT:     Head: Normocephalic and atraumatic.  Eyes:     Extraocular Movements: Extraocular movements intact.  Cardiovascular:     Rate and Rhythm: Normal rate and regular rhythm.     Pulses: Normal pulses.  Pulmonary:     Effort: Pulmonary effort is normal. No respiratory distress.     Breath sounds: Normal breath sounds. No wheezing or rales.  Abdominal:     General: There is distension.     Palpations: Abdomen is soft.     Tenderness: There is abdominal tenderness.     Comments: Hypoactive bowel sounds  Musculoskeletal:     Cervical back: Normal  range of motion.     Right lower leg: No edema.     Left lower leg: No edema.  Skin:    General: Skin is warm and dry.  Neurological:     General: No focal deficit present.     Mental Status: He is alert and oriented to person, place, and time.     Labs on Admission: I have personally reviewed following labs and imaging studies  CBC: Recent Labs  Lab 06/08/22 1556  WBC 11.5*  NEUTROABS 8.3*  HGB 15.0  HCT 44.9  MCV 88.0  PLT 299   Basic Metabolic Panel: Recent Labs  Lab  06/08/22 1640  NA 136  K 3.6  CL 105  CO2 23  GLUCOSE 114*  BUN 13  CREATININE 1.00  CALCIUM 8.0*   GFR: Estimated Creatinine Clearance: 74.9 mL/min (by C-G formula based on SCr of 1 mg/dL). Liver Function Tests: Recent Labs  Lab 06/08/22 1640  AST 20  ALT 19  ALKPHOS 55  BILITOT 0.8  PROT 6.7  ALBUMIN 3.4*   Recent Labs  Lab 06/08/22 1640  LIPASE 31   No results for input(s): "AMMONIA" in the last 168 hours. Coagulation Profile: No results for input(s): "INR", "PROTIME" in the last 168 hours. Cardiac Enzymes: No results for input(s): "CKTOTAL", "CKMB", "CKMBINDEX", "TROPONINI" in the last 168 hours. BNP (last 3 results) No results for input(s): "PROBNP" in the last 8760 hours. HbA1C: No results for input(s): "HGBA1C" in the last 72 hours. CBG: No results for input(s): "GLUCAP" in the last 168 hours. Lipid Profile: No results for input(s): "CHOL", "HDL", "LDLCALC", "TRIG", "CHOLHDL", "LDLDIRECT" in the last 72 hours. Thyroid Function Tests: No results for input(s): "TSH", "T4TOTAL", "FREET4", "T3FREE", "THYROIDAB" in the last 72 hours. Anemia Panel: No results for input(s): "VITAMINB12", "FOLATE", "FERRITIN", "TIBC", "IRON", "RETICCTPCT" in the last 72 hours. Urine analysis:    Component Value Date/Time   COLORURINE YELLOW 06/08/2022 1557   APPEARANCEUR CLEAR 06/08/2022 1557   LABSPEC >=1.030 06/08/2022 1557   PHURINE 5.5 06/08/2022 1557   GLUCOSEU NEGATIVE 06/08/2022 1557   HGBUR NEGATIVE 06/08/2022 1557   BILIRUBINUR SMALL (A) 06/08/2022 1557   KETONESUR NEGATIVE 06/08/2022 1557   PROTEINUR 30 (A) 06/08/2022 1557   NITRITE NEGATIVE 06/08/2022 1557   LEUKOCYTESUR NEGATIVE 06/08/2022 1557    Radiological Exams on Admission: DG Abd Portable 1V  Result Date: 06/08/2022 CLINICAL DATA:  NG tube placement EXAM: PORTABLE ABDOMEN - 1 VIEW COMPARISON:  CT today FINDINGS: NG tube tip is in the proximal stomach near the GE junction. Side port is in the distal  esophagus. Gaseous distention of the visualized colon. Visualized lung bases clear. IMPRESSION: NG tube tip near the GE junction with the side port in the distal esophagus. This could be advanced several cm for optimal positioning. Electronically Signed   By: Charlett Nose M.D.   On: 06/08/2022 19:21   CT ABDOMEN PELVIS W CONTRAST  Result Date: 06/08/2022 CLINICAL DATA:  Abdominal pain, acute, nonlocalized EXAM: CT ABDOMEN AND PELVIS WITH CONTRAST TECHNIQUE: Multidetector CT imaging of the abdomen and pelvis was performed using the standard protocol following bolus administration of intravenous contrast. RADIATION DOSE REDUCTION: This exam was performed according to the departmental dose-optimization program which includes automated exposure control, adjustment of the mA and/or kV according to patient size and/or use of iterative reconstruction technique. 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 Small hiatal hernia. Hepatobiliary: Stable scattered subcentimeter and pericentimeter hypodense hepatic lesions with largest measuring 2.2 x 1.7 cm with a density of 6 Hounsfield units. These likely represent simple hepatic cysts. No gallstones, gallbladder wall thickening, or pericholecystic fluid. No biliary dilatation. Pancreas: No focal lesion. Normal pancreatic contour. No surrounding inflammatory changes. No main pancreatic ductal dilatation. Spleen: Normal in size without focal abnormality. Adrenals/Urinary Tract: No adrenal nodule bilaterally. Bilateral kidneys enhance symmetrically. Punctate calcification within the left kidney. No ureterolithiasis. No right nephrolithiasis. No hydronephrosis. No hydroureter. The urinary bladder is unremarkable. On delayed imaging, there is no urothelial wall thickening and there are no filling defects in the opacified  portions of the bilateral collecting systems or ureters. Stomach/Bowel: Stomach is within normal limits. Irregular bowel wall thickening of the sigmoid colon extending approximately 9 cm in the craniocaudal dimension with associated gaseous and liquid stool dilatation of the proximal large bowel up to a caliber of 9 cm. No evidence of small bowel wall thickening or dilatation. Appendix appears normal. Vascular/Lymphatic: No abdominal aorta or iliac aneurysm. Severe atherosclerotic plaque of the aorta and its branches. Multiple prominent but non-enlarged pericolonic lymph nodes along the rectum and sigmoid colon (2:65). No abdominal or inguinal lymphadenopathy. Reproductive: Prominent prostate measuring up to 4.6 cm Other: No intraperitoneal free fluid. No intraperitoneal free gas. No organized fluid collection. Musculoskeletal: No abdominal wall hernia or abnormality. No suspicious lytic or blastic osseous lesions. No acute displaced fracture. Multilevel degenerative changes of the spine. IMPRESSION: 1. Obstructive sigmoid mass extending approximately 9 cm in the craniocaudal dimension. Findings suggestive of malignancy. 2. High-grade large bowel obstruction with no findings of perforation. Recommend surgical consultation. 3. Multiple prominent but non-enlarged pericolonic lymph nodes along the rectum and sigmoid colon. Recommend attention on follow-up. 4. Colonic diverticulosis with no acute diverticulitis. 5. Nonobstructive punctate right nephrolithiasis. 6. Prominent prostate. 7. Small hiatal hernia. 8.  Aortic Atherosclerosis (ICD10-I70.0). Electronically Signed   By: Tish Frederickson M.D.   On: 06/08/2022 18:21    Assessment and Plan  Obstructive sigmoid mass High-grade large bowel obstruction Patient presenting with complaints of abdominal pain and distention.  CT showing obstructive sigmoid mass and high-grade large bowel obstruction without findings of perforation.  General surgery consulted and NG tube  placed.  Keep n.p.o., IV fluid hydration, pain management, and antiemetic as needed (EKG ordered to check QT interval).  DVT prophylaxis: SCDs Code Status: Discussed CODE STATUS with the patient and he wishes to be DNR. Family Communication: No family available at this time. Level of care: Med-Surg Admission status: It is my clinical opinion that admission to INPATIENT is reasonable and necessary because of the expectation that this patient will require hospital care that crosses at least 2 midnights to treat this condition based on the medical complexity of the problems presented.  Given the aforementioned information, the predictability of an adverse outcome is felt to be significant.   John Giovanni MD [facility]  If 7PM-7AM, please contact night-coverage [email or web address]  06/08/2022, 9:43 PM

## 2022-06-08 NOTE — ED Provider Notes (Signed)
[place] [facility] Provider Note   CSN: [id number] Arrival date & time: 06/08/22  1503     History  Chief Complaint  Patient presents with   Abdominal Pain    Roger Mcdonald is a 72 y.o. male.  Patient here with abdominal pain.  Worse here the last few days and has been have a lot of discomfort the last month or so.  Worse here now with little bit of diarrhea.  Denies any suspicious food intake or fever or chills.  No sick contacts.  Denies any chest pain or shortness of breath.  Denies any weakness, numbness.  No recent illnesses.  No history of abdominal surgery.  The history is provided by the patient.       Home Medications Prior to Admission medications   Not on File      Allergies    Patient has no known allergies.    Review of Systems   Review of Systems  Physical Exam Updated Vital Signs BP (!) 145/87   Pulse 64   Temp 98 F (36.7 C) (Oral)   Resp 18   Ht 5\' 7"  (1.702 m)   Wt 99.2 kg   SpO2 97%   BMI 34.24 kg/m  Physical Exam Vitals and nursing note reviewed.  Constitutional:      General: He is not in acute distress.    Appearance: He is well-developed. He is not ill-appearing.  HENT:     Head: Normocephalic and atraumatic.     Mouth/Throat:     Mouth: Mucous membranes are moist.  Eyes:     Extraocular Movements: Extraocular movements intact.     Conjunctiva/sclera: Conjunctivae normal.     Pupils: Pupils are equal, round, and reactive to light.  Cardiovascular:     Rate and Rhythm: Normal rate and regular rhythm.     Heart sounds: Normal heart sounds. No murmur heard. Pulmonary:     Effort: Pulmonary effort is normal. No respiratory distress.     Breath sounds: Normal breath sounds.  Abdominal:     Palpations: Abdomen is soft.     Tenderness: There is abdominal tenderness.  Musculoskeletal:        General: No swelling.     Cervical back: Neck supple.  Skin:    General: Skin is warm and dry.      Capillary Refill: Capillary refill takes less than 2 seconds.  Neurological:     General: No focal deficit present.     Mental Status: He is alert.  Psychiatric:        Mood and Affect: Mood normal.     ED Results / Procedures / Treatments   Labs (all labs ordered are listed, but only abnormal results are displayed) Labs Reviewed  CBC WITH DIFFERENTIAL/PLATELET - Abnormal; Notable for the following components:      Result Value   WBC 11.5 (*)    Neutro Abs 8.3 (*)    All other components within normal limits  URINALYSIS, ROUTINE W REFLEX MICROSCOPIC - Abnormal; Notable for the following components:   Bilirubin Urine SMALL (*)    Protein, ur 30 (*)    All other components within normal limits  URINALYSIS, MICROSCOPIC (REFLEX) - Abnormal; Notable for the following components:   Bacteria, UA RARE (*)    All other components within normal limits  COMPREHENSIVE METABOLIC PANEL - Abnormal; Notable for the following components:   Glucose, Bld 114 (*)    Calcium 8.0 (*)  Albumin 3.4 (*)    All other components within normal limits  LIPASE, BLOOD    EKG None  Radiology CT ABDOMEN PELVIS W CONTRAST  Result Date: 06/08/2022 CLINICAL DATA:  Abdominal pain, acute, nonlocalized EXAM: CT ABDOMEN AND PELVIS WITH CONTRAST TECHNIQUE: Multidetector CT imaging of the abdomen and pelvis was performed using the standard protocol following bolus administration of intravenous contrast. RADIATION DOSE REDUCTION: This exam was performed according to the departmental dose-optimization program which includes automated exposure control, adjustment of the mA and/or kV according to patient size and/or use of iterative reconstruction technique. 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 Small hiatal hernia. Hepatobiliary: Stable scattered  subcentimeter and pericentimeter hypodense hepatic lesions with largest measuring 2.2 x 1.7 cm with a density of 6 Hounsfield units. These likely represent simple hepatic cysts. No gallstones, gallbladder wall thickening, or pericholecystic fluid. No biliary dilatation. Pancreas: No focal lesion. Normal pancreatic contour. No surrounding inflammatory changes. No main pancreatic ductal dilatation. Spleen: Normal in size without focal abnormality. Adrenals/Urinary Tract: No adrenal nodule bilaterally. Bilateral kidneys enhance symmetrically. Punctate calcification within the left kidney. No ureterolithiasis. No right nephrolithiasis. No hydronephrosis. No hydroureter. The urinary bladder is unremarkable. On delayed imaging, there is no urothelial wall thickening and there are no filling defects in the opacified portions of the bilateral collecting systems or ureters. Stomach/Bowel: Stomach is within normal limits. Irregular bowel wall thickening of the sigmoid colon extending approximately 9 cm in the craniocaudal dimension with associated gaseous and liquid stool dilatation of the proximal large bowel up to a caliber of 9 cm. No evidence of small bowel wall thickening or dilatation. Appendix appears normal. Vascular/Lymphatic: No abdominal aorta or iliac aneurysm. Severe atherosclerotic plaque of the aorta and its branches. Multiple prominent but non-enlarged pericolonic lymph nodes along the rectum and sigmoid colon (2:65). No abdominal or inguinal lymphadenopathy. Reproductive: Prominent prostate measuring up to 4.6 cm Other: No intraperitoneal free fluid. No intraperitoneal free gas. No organized fluid collection. Musculoskeletal: No abdominal wall hernia or abnormality. No suspicious lytic or blastic osseous lesions. No acute displaced fracture. Multilevel degenerative changes of the spine. IMPRESSION: 1. Obstructive sigmoid mass extending approximately 9 cm in the craniocaudal dimension. Findings suggestive of  malignancy. 2. High-grade large bowel obstruction with no findings of perforation. Recommend surgical consultation. 3. Multiple prominent but non-enlarged pericolonic lymph nodes along the rectum and sigmoid colon. Recommend attention on follow-up. 4. Colonic diverticulosis with no acute diverticulitis. 5. Nonobstructive punctate right nephrolithiasis. 6. Prominent prostate. 7. Small hiatal hernia. 8.  Aortic Atherosclerosis (ICD10-I70.0). Electronically Signed   By: Tish Frederickson M.D.   On: 06/08/2022 18:21    Procedures Procedures    Medications Ordered in ED Medications  ondansetron (ZOFRAN) injection 4 mg (4 mg Intravenous Not Given 06/08/22 1605)  lidocaine (XYLOCAINE) 2 % jelly 1 Application (has no administration in time range)  sodium chloride 0.9 % bolus 1,000 mL (0 mLs Intravenous Stopped 06/08/22 1727)  acetaminophen (TYLENOL) tablet 1,000 mg (1,000 mg Oral Given 06/08/22 1603)  iohexol (OMNIPAQUE) 300 MG/ML solution 100 mL (100 mLs Intravenous Contrast Given 06/08/22 1736)    ED Course/ Medical Decision Making/ A&P                             Medical Decision Making Amount and/or Complexity of Data Reviewed Labs: ordered.  Radiology: ordered.  Risk OTC drugs. Prescription drug management. Decision regarding hospitalization.   Roger Mcdonald is here with abdominal pain.  Normal vitals.  No fever.  Will get CBC, CMP, lipase, urinalysis, CT scan abdomen pelvis.  Will give IV fluid bolus.  Differential diagnosis includes viral process/gas/constipation versus bowel obstruction versus diverticulitis versus less likely UTI.  CT scan per radiology report does show sigmoid colon mass with large bowel obstruction.  But no perforation.  Lab work is otherwise unremarkable.  I reviewed and interpreted labs and imaging.  Findings are consistent with likely malignancy.  I talked with Dr. Maisie Fus with general surgery.  Will place an NG tube and have him admitted to the hospital service for  further care.  Right now he is feeling comfortable with Tylenol.  He does not want to have any narcotic pain medicine.  This chart was dictated using voice recognition software.  Despite best efforts to proofread,  errors can occur which can change the documentation meaning.         Final Clinical Impression(s) / ED Diagnoses Final diagnoses:  Colonic mass  Complete intestinal obstruction, unspecified cause [facility])    Rx / DC Orders ED Discharge Orders     None         Virgina Norfolk, DO 06/08/22 1610

## 2022-06-08 NOTE — Progress Notes (Addendum)
Plan of Care Note for accepted transfer   Patient: ANAIS EISCHENS MRN: [id number]   DOA: 06/08/2022  Facility requesting transfer: [facility] Requesting Provider: Dr Lockie Mola Reason for transfer: Large bowel obstruction Facility course:   72 year old with no known past medical history who presents to [facility] ED due to abdominal pain which worsened within the last few days.  He complained of about 1 month of abdominal discomfort during which she had a lot of gas and now presents with some diarrhea.  He denies chest pain, shortness of breath, fever, chills, suspicious food intact and sick contacts.  He denies any prior history of abdominal surgery. In the ED, vital signs was within normal range.  Workup in the ED showed normal CBC except for WBC of 11.5, BMP was normal except for blood glucose of 114 and albumin 3.4.  Urinalysis was normal, lipase 31. CT abdomen pelvis with contrast done showed obstructive sigmoid mass extending approximately 9 cm in the craniocaudal dimension.  Findings suggestive of malignancy.  High-grade large bowel obstruction with no findings of perforation. General surgery (Dr. Maisie Fus) was consulted and recommended NG tube and to admit to hospitalist service for further care.  Tylenol was given.  [facility] ED will continue to responsible for care of patient on the patient arrives to [facility].  Plan of care: The patient is accepted for admission to Med-surg  unit, at [facility]..    Author: Frankey Shown, DO 06/08/2022  Check [email or web address] for on-call coverage.  Nursing staff, Please call [facility] Admits & Consults System-Wide number on Amion as soon as patient's arrival, so appropriate admitting provider can evaluate the pt.

## 2022-06-08 NOTE — ED Notes (Signed)
Attempted to call report. RN with a pt and asked to be called back in 15 mins.

## 2022-06-08 NOTE — ED Notes (Signed)
ED Provider at bedside. 

## 2022-06-08 NOTE — ED Notes (Signed)
Pt gave verbal consent for transport. Pt also has a pocket knife that was given to securtity.

## 2022-06-09 ENCOUNTER — Inpatient Hospital Stay (HOSPITAL_COMMUNITY): Payer: Medicare Other | Admitting: Anesthesiology

## 2022-06-09 ENCOUNTER — Encounter (HOSPITAL_COMMUNITY)
Admission: EM | Disposition: A | Discharge status: Home Health | Payer: Self-pay | Source: Home / Self Care | Attending: Internal Medicine

## 2022-06-09 ENCOUNTER — Other Ambulatory Visit: Payer: Self-pay

## 2022-06-09 DIAGNOSIS — C187 Malignant neoplasm of sigmoid colon: Secondary | ICD-10-CM | POA: Diagnosis not present

## 2022-06-09 DIAGNOSIS — K56609 Unspecified intestinal obstruction, unspecified as to partial versus complete obstruction: Secondary | ICD-10-CM | POA: Diagnosis not present

## 2022-06-09 DIAGNOSIS — F17210 Nicotine dependence, cigarettes, uncomplicated: Secondary | ICD-10-CM

## 2022-06-09 HISTORY — PX: LAPAROTOMY: SHX154

## 2022-06-09 LAB — CBC
HCT: 48.4 % (ref 39.0–52.0)
Hemoglobin: 15.9 g/dL (ref 13.0–17.0)
MCH: 29.5 pg (ref 26.0–34.0)
MCHC: 32.9 g/dL (ref 30.0–36.0)
MCV: 89.8 fL (ref 80.0–100.0)
Platelets: 278 10*3/uL (ref 150–400)
RBC: 5.39 MIL/uL (ref 4.22–5.81)
RDW: 13.1 % (ref 11.5–15.5)
WBC: 11.4 10*3/uL — ABNORMAL HIGH (ref 4.0–10.5)
nRBC: 0 % (ref 0.0–0.2)

## 2022-06-09 LAB — BASIC METABOLIC PANEL
Anion gap: 11 (ref 5–15)
BUN: 9 mg/dL (ref 8–23)
CO2: 19 mmol/L — ABNORMAL LOW (ref 22–32)
Calcium: 8.1 mg/dL — ABNORMAL LOW (ref 8.9–10.3)
Chloride: 106 mmol/L (ref 98–111)
Creatinine, Ser: 0.9 mg/dL (ref 0.61–1.24)
GFR, Estimated: >60 mL/min (ref >60)
Glucose, Bld: 123 mg/dL — ABNORMAL HIGH (ref 70–99)
Potassium: 3.1 mmol/L — ABNORMAL LOW (ref 3.5–5.1)
Sodium: 136 mmol/L (ref 135–145)

## 2022-06-09 LAB — TROPONIN I (HIGH SENSITIVITY): Troponin I (High Sensitivity): 3 ng/L (ref <18)

## 2022-06-09 LAB — SURGICAL PCR SCREEN
MRSA, PCR: NEGATIVE
Staphylococcus aureus: NEGATIVE

## 2022-06-09 LAB — MAGNESIUM: Magnesium: 2.3 mg/dL (ref 1.7–2.4)

## 2022-06-09 SURGERY — LAPAROTOMY, EXPLORATORY
Anesthesia: General

## 2022-06-09 MED ORDER — DEXMEDETOMIDINE HCL IN NACL 80 MCG/20ML IV SOLN
INTRAVENOUS | Status: DC | PRN
  Administered 2022-06-09 (×2): 8 ug via INTRAVENOUS

## 2022-06-09 MED ORDER — PROPOFOL 10 MG/ML IV BOLUS
INTRAVENOUS | Status: DC | PRN
  Administered 2022-06-09: 150 mg via INTRAVENOUS

## 2022-06-09 MED ORDER — ROCURONIUM BROMIDE 10 MG/ML (PF) SYRINGE
PREFILLED_SYRINGE | INTRAVENOUS | Status: DC | PRN
  Administered 2022-06-09: 50 mg via INTRAVENOUS

## 2022-06-09 MED ORDER — MORPHINE SULFATE 1 MG/ML IV SOLN PCA
INTRAVENOUS | Status: DC
  Administered 2022-06-09: 13.9 mg via INTRAVENOUS
  Administered 2022-06-09 – 2022-06-10 (×3): 4.5 mg via INTRAVENOUS
  Administered 2022-06-10: 1.5 mg via INTRAVENOUS
  Administered 2022-06-10: 9 mg via INTRAVENOUS
  Administered 2022-06-10: 4.5 mg via INTRAVENOUS
  Administered 2022-06-11: 1.5 mg via INTRAVENOUS
  Filled 2022-06-09 (×2): qty 30

## 2022-06-09 MED ORDER — SUGAMMADEX SODIUM 200 MG/2ML IV SOLN
INTRAVENOUS | Status: DC | PRN
  Administered 2022-06-09: 200 mg via INTRAVENOUS

## 2022-06-09 MED ORDER — KETAMINE HCL 10 MG/ML IJ SOLN
INTRAMUSCULAR | Status: DC | PRN
  Administered 2022-06-09: 20 mg via INTRAVENOUS
  Administered 2022-06-09: 30 mg via INTRAVENOUS

## 2022-06-09 MED ORDER — SODIUM CHLORIDE 0.9 % IV SOLN
INTRAVENOUS | Status: AC
  Filled 2022-06-09: qty 2

## 2022-06-09 MED ORDER — LIDOCAINE 2% (20 MG/ML) 5 ML SYRINGE
INTRAMUSCULAR | Status: DC | PRN
  Administered 2022-06-09: 100 mg via INTRAVENOUS

## 2022-06-09 MED ORDER — LACTATED RINGERS IV SOLN: INTRAVENOUS | Status: DC | PRN

## 2022-06-09 MED ORDER — MUPIROCIN 2 % EX OINT: 1.0000 | TOPICAL_OINTMENT | Freq: Two times a day (BID) | CUTANEOUS | Status: DC

## 2022-06-09 MED ORDER — FENTANYL CITRATE (PF) 100 MCG/2ML IJ SOLN
INTRAMUSCULAR | Status: AC
  Filled 2022-06-09: qty 2

## 2022-06-09 MED ORDER — KETAMINE HCL 50 MG/5ML IJ SOSY
PREFILLED_SYRINGE | INTRAMUSCULAR | Status: AC
  Filled 2022-06-09: qty 5

## 2022-06-09 MED ORDER — FENTANYL CITRATE (PF) 100 MCG/2ML IJ SOLN
INTRAMUSCULAR | Status: DC | PRN
  Administered 2022-06-09: 100 ug via INTRAVENOUS

## 2022-06-09 MED ORDER — SODIUM CHLORIDE 0.9% FLUSH: 9.0000 mL | INTRAVENOUS | Status: DC | PRN

## 2022-06-09 MED ORDER — SUCCINYLCHOLINE CHLORIDE 200 MG/10ML IV SOSY
PREFILLED_SYRINGE | INTRAVENOUS | Status: DC | PRN
  Administered 2022-06-09: 140 mg via INTRAVENOUS

## 2022-06-09 MED ORDER — ONDANSETRON HCL 4 MG/2ML IJ SOLN: 4.0000 mg | Freq: Four times a day (QID) | INTRAMUSCULAR | Status: DC | PRN

## 2022-06-09 MED ORDER — MIDAZOLAM HCL 5 MG/5ML IJ SOLN
INTRAMUSCULAR | Status: DC | PRN
  Administered 2022-06-09: 2 mg via INTRAVENOUS

## 2022-06-09 MED ORDER — LIP MEDEX EX OINT: TOPICAL_OINTMENT | CUTANEOUS | Status: DC | PRN

## 2022-06-09 MED ORDER — DIPHENHYDRAMINE HCL 12.5 MG/5ML PO ELIX: 12.5000 mg | ORAL_SOLUTION | Freq: Four times a day (QID) | ORAL | Status: DC | PRN

## 2022-06-09 MED ORDER — FENTANYL CITRATE PF 50 MCG/ML IJ SOSY
PREFILLED_SYRINGE | INTRAMUSCULAR | Status: AC
  Filled 2022-06-09: qty 1

## 2022-06-09 MED ORDER — DIPHENHYDRAMINE HCL 50 MG/ML IJ SOLN: 12.5000 mg | Freq: Four times a day (QID) | INTRAMUSCULAR | Status: DC | PRN

## 2022-06-09 MED ORDER — LIDOCAINE HCL (PF) 2 % IJ SOLN
INTRAMUSCULAR | Status: DC | PRN
  Administered 2022-06-09: 1.5 mg/kg/h via INTRADERMAL

## 2022-06-09 MED ORDER — SODIUM CHLORIDE 0.9 % IV SOLN: INTRAVENOUS | Status: AC

## 2022-06-09 MED ORDER — NALOXONE HCL 0.4 MG/ML IJ SOLN: 0.4000 mg | INTRAMUSCULAR | Status: DC | PRN

## 2022-06-09 MED ORDER — PHENYLEPHRINE 80 MCG/ML (10ML) SYRINGE FOR IV PUSH (FOR BLOOD PRESSURE SUPPORT)
PREFILLED_SYRINGE | INTRAVENOUS | Status: DC | PRN
  Administered 2022-06-09: 80 ug via INTRAVENOUS
  Administered 2022-06-09: 160 ug via INTRAVENOUS

## 2022-06-09 MED ORDER — LIDOCAINE HCL (PF) 2 % IJ SOLN
INTRAMUSCULAR | Status: AC
  Filled 2022-06-09: qty 10

## 2022-06-09 MED ORDER — FENTANYL CITRATE PF 50 MCG/ML IJ SOSY
25.0000 ug | PREFILLED_SYRINGE | INTRAMUSCULAR | Status: DC | PRN
  Administered 2022-06-09 (×3): 50 ug via INTRAVENOUS

## 2022-06-09 MED ORDER — ALBUMIN HUMAN 5 % IV SOLN: INTRAVENOUS | Status: DC | PRN

## 2022-06-09 MED ORDER — POTASSIUM CHLORIDE 10 MEQ/100ML IV SOLN
10.0000 meq | INTRAVENOUS | Status: AC
  Administered 2022-06-09 (×2): 100 mL via INTRAVENOUS
  Administered 2022-06-09: 10 meq via INTRAVENOUS
  Filled 2022-06-09 (×3): qty 100

## 2022-06-09 MED ORDER — PROPOFOL 10 MG/ML IV BOLUS
INTRAVENOUS | Status: AC
  Filled 2022-06-09: qty 20

## 2022-06-09 MED ORDER — 0.9 % SODIUM CHLORIDE (POUR BTL) OPTIME
TOPICAL | Status: DC | PRN
  Administered 2022-06-09: 2000 mL

## 2022-06-09 MED ORDER — AMISULPRIDE (ANTIEMETIC) 5 MG/2ML IV SOLN: 10.0000 mg | Freq: Once | INTRAVENOUS | Status: DC | PRN

## 2022-06-09 MED ORDER — MIDAZOLAM HCL 2 MG/2ML IJ SOLN
INTRAMUSCULAR | Status: AC
  Filled 2022-06-09: qty 2

## 2022-06-09 MED ORDER — DEXAMETHASONE SODIUM PHOSPHATE 10 MG/ML IJ SOLN
INTRAMUSCULAR | Status: DC | PRN
  Administered 2022-06-09: 10 mg via INTRAVENOUS

## 2022-06-09 MED ORDER — SODIUM CHLORIDE 0.9 % IV SOLN
2.0000 g | INTRAVENOUS | Status: AC
  Administered 2022-06-09: 2 g via INTRAVENOUS

## 2022-06-09 MED ORDER — PROMETHAZINE HCL 25 MG/ML IJ SOLN: 6.2500 mg | INTRAMUSCULAR | Status: DC | PRN

## 2022-06-09 MED ORDER — FENTANYL CITRATE PF 50 MCG/ML IJ SOSY
PREFILLED_SYRINGE | INTRAMUSCULAR | Status: AC
  Filled 2022-06-09: qty 2

## 2022-06-09 SURGICAL SUPPLY — 43 items
APL PRP STRL LF DISP 70% ISPRP (MISCELLANEOUS) ×1
BAG COUNTER SPONGE SURGICOUNT (BAG) IMPLANT
BAG SPNG CNTER NS LX DISP (BAG)
BLADE EXTENDED COATED 6.5IN (ELECTRODE) IMPLANT
CHLORAPREP W/TINT 26 (MISCELLANEOUS) ×1 IMPLANT
COVER MAYO STAND STRL (DRAPES) ×1 IMPLANT
COVER SURGICAL LIGHT HANDLE (MISCELLANEOUS) ×1 IMPLANT
DRAPE LAPAROSCOPIC ABDOMINAL (DRAPES) ×1 IMPLANT
DRAPE WARM FLUID 44X44 (DRAPES) ×1 IMPLANT
DRSG OPSITE POSTOP 4X10 (GAUZE/BANDAGES/DRESSINGS) IMPLANT
ELECT REM PT RETURN 15FT ADLT (MISCELLANEOUS) ×1 IMPLANT
GLOVE BIO SURGEON STRL SZ7.5 (GLOVE) ×1 IMPLANT
GLOVE BIOGEL PI IND STRL 7.0 (GLOVE) ×1 IMPLANT
GOWN STRL REUS W/ TWL XL LVL3 (GOWN DISPOSABLE) ×1 IMPLANT
GOWN STRL REUS W/TWL XL LVL3 (GOWN DISPOSABLE) ×1
HANDLE SUCTION POOLE (INSTRUMENTS) ×1 IMPLANT
KIT BASIN OR (CUSTOM PROCEDURE TRAY) ×1 IMPLANT
KIT TURNOVER KIT A (KITS) IMPLANT
LIGASURE IMPACT 36 18CM CVD LR (INSTRUMENTS) IMPLANT
NS IRRIG 1000ML POUR BTL (IV SOLUTION) ×1 IMPLANT
PACK GENERAL/GYN (CUSTOM PROCEDURE TRAY) ×1 IMPLANT
POUCH OSTOMY FLEX CONVEX 2-1/8 (OSTOMY) IMPLANT
RELOAD PROXIMATE 100 BLUE (MISCELLANEOUS) ×1 IMPLANT
RELOAD PROXIMATE 100MM BLUE (MISCELLANEOUS) ×1
RELOAD PROXIMATE 75MM BLUE (ENDOMECHANICALS) IMPLANT
RELOAD STAPLE 100 3.8 BLU REG (MISCELLANEOUS) IMPLANT
RELOAD STAPLE 75 3.8 BLU REG (ENDOMECHANICALS) IMPLANT
STAPLER CVD CUT GN 40 RELOAD (ENDOMECHANICALS) ×1 IMPLANT
STAPLER CVD CUT GRN 40 RELOAD (ENDOMECHANICALS) IMPLANT
STAPLER GUN LINEAR PROX 60 (STAPLE) IMPLANT
STAPLER PROXIMATE 100MM BLUE (MISCELLANEOUS) IMPLANT
STAPLER PROXIMATE 75MM BLUE (STAPLE) IMPLANT
STAPLER VISISTAT 35W (STAPLE) IMPLANT
SUCTION POOLE HANDLE (INSTRUMENTS) ×1
SUT PDS AB 1 TP1 96 (SUTURE) IMPLANT
SUT PROLENE 3 0 PS 1 (SUTURE) IMPLANT
SUT SILK 2 0 (SUTURE) ×1
SUT SILK 2 0 SH CR/8 (SUTURE) ×1 IMPLANT
SUT SILK 2-0 18XBRD TIE 12 (SUTURE) ×1 IMPLANT
SUT SILK 3 0 SH CR/8 (SUTURE) ×1 IMPLANT
SUT VIC AB 3-0 SH 18 (SUTURE) IMPLANT
TOWEL OR 17X26 10 PK STRL BLUE (TOWEL DISPOSABLE) ×1 IMPLANT
TRAY FOLEY MTR SLVR 16FR STAT (SET/KITS/TRAYS/PACK) ×1 IMPLANT

## 2022-06-09 NOTE — Progress Notes (Signed)
PROGRESS NOTE  Roger Mcdonald WGN:[id number] DOB: Aug 09, 1950 DOA: 06/08/2022 PCP: Pcp, No  HPI/Recap of past 24 hours: Roger Mcdonald is a 72 y.o. male with no significant past medical history and surgical history of C-spine surgery and tonsillectomy presented to the ED with abdominal pain and distension for the past 2 months.  CT abdomen/pelvis showed obstructive sigmoid mass extending approximately 9 cm in the craniocaudal dimension, findings suggestive of malignancy, high-grade large bowel obstruction with no findings of perforation. General surgery was consulted and NG tube placed.  Patient admitted for further management.    Today, patient could not tolerate his NG tube.  Patient complaining of abdominal pain, some nausea but denies any vomiting.   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   Weight as of this encounter: 99.2 kg.      Code Status: DNR  Family Communication: None at bedside  Disposition Plan: Status is: Inpatient Remains inpatient appropriate because: Level of care      Consultants: General surgery  Procedures: Surgery as above  Antimicrobials: None  DVT prophylaxis: As per surgery    Objective: Vitals:   06/09/22 1345 06/09/22 1350 06/09/22 1407 06/09/22 1458  BP: 117/89  (!) 142/82 (!) 144/87   Pulse: 67 71 72 79  Resp: (!) 21 (!) 22 16 18   Temp: 98.1 F (36.7 C)   97.7 F (36.5 C)  TempSrc:    Oral  SpO2: 99% 99% 98% 95%  Weight:      Height:        Intake/Output Summary (Last 24 hours) at 06/09/2022 1519 Last data filed at 06/09/2022 1400 Gross per 24 hour  Intake 4007.77 ml  Output 655 ml  Net 3352.77 ml   Filed Weights   06/08/22 1511  Weight: 99.2 kg    Exam:  General: NAD  Cardiovascular: S1, S2 present Respiratory: CTAB Abdomen: Soft, tender, distended, bowel sounds present Musculoskeletal: No bilateral pedal edema noted Skin: Normal Psychiatry: Normal mood   Data Reviewed: CBC: Recent Labs  Lab 06/08/22 1556 06/09/22 0402  WBC 11.5* 11.4*  NEUTROABS 8.3*  --   HGB 15.0 15.9  HCT 44.9 48.4  MCV 88.0 89.8  PLT 299 278   Basic Metabolic Panel: Recent Labs  Lab 06/08/22 1640 06/09/22 0402  NA 136 136  K 3.6 3.1*  CL 105 106  CO2 23 19*  GLUCOSE 114* 123*  BUN 13 9  CREATININE 1.00 0.90  CALCIUM 8.0* 8.1*  MG  --  2.3   GFR: Estimated Creatinine Clearance: 83.2 mL/min (by C-G formula based on SCr of 0.9 mg/dL). Liver Function Tests: Recent Labs  Lab 06/08/22 1640  AST 20  ALT 19  ALKPHOS 55  BILITOT 0.8  PROT 6.7  ALBUMIN 3.4*   Recent Labs  Lab 06/08/22 1640  LIPASE 31   No results for input(s): "AMMONIA" in the last 168 hours. Coagulation Profile: No results for input(s): "INR", "PROTIME" in the last  168 hours. Cardiac Enzymes: No results for input(s): "CKTOTAL", "CKMB", "CKMBINDEX", "TROPONINI" in the last 168 hours. BNP (last 3 results) No results for input(s): "PROBNP" in the last 8760 hours. HbA1C: No results for input(s): "HGBA1C" in the last 72 hours. CBG: No results for input(s): "GLUCAP" in the last 168 hours. Lipid Profile: No results for input(s): "CHOL", "HDL", "LDLCALC", "TRIG", "CHOLHDL", "LDLDIRECT" in the last 72 hours. Thyroid Function Tests: No results for input(s): "TSH", "T4TOTAL",  "FREET4", "T3FREE", "THYROIDAB" in the last 72 hours. Anemia Panel: No results for input(s): "VITAMINB12", "FOLATE", "FERRITIN", "TIBC", "IRON", "RETICCTPCT" in the last 72 hours. Urine analysis:    Component Value Date/Time   COLORURINE YELLOW 06/08/2022 1557   APPEARANCEUR CLEAR 06/08/2022 1557   LABSPEC >=1.030 06/08/2022 1557   PHURINE 5.5 06/08/2022 1557   GLUCOSEU NEGATIVE 06/08/2022 1557   HGBUR NEGATIVE 06/08/2022 1557   BILIRUBINUR SMALL (A) 06/08/2022 1557   KETONESUR NEGATIVE 06/08/2022 1557   PROTEINUR 30 (A) 06/08/2022 1557   NITRITE NEGATIVE 06/08/2022 1557   LEUKOCYTESUR NEGATIVE 06/08/2022 1557   Sepsis Labs: @LABRCNTIP (procalcitonin:4,lacticidven:4)  ) Recent Results (from the past 240 hour(s))  Surgical PCR screen     Status: None   Collection Time: 06/09/22  8:33 AM   Specimen: Nasal Mucosa; Nasal Swab  Result Value Ref Range Status   MRSA, PCR NEGATIVE NEGATIVE Final   Staphylococcus aureus NEGATIVE NEGATIVE Final    Comment: (NOTE) The Xpert SA Assay (FDA approved for NASAL specimens in patients 108 years of age and older), is one component of a comprehensive surveillance program. It is not intended to diagnose infection nor to guide or monitor treatment. Performed at [facility], [address], [place], [place] [address]       Studies: DG Abd Portable 1V  Result Date: 06/08/2022 CLINICAL DATA:  NG tube placement EXAM: PORTABLE ABDOMEN - 1 VIEW COMPARISON:  CT today FINDINGS: NG tube tip is in the proximal stomach near the GE junction. Side port is in the distal esophagus. Gaseous distention of the visualized colon. Visualized lung bases clear. IMPRESSION: NG tube tip near the GE junction with the side port in the distal esophagus. This could be advanced several cm for optimal positioning. Electronically Signed   By: Charlett Nose M.D.   On: 06/08/2022 19:21   CT ABDOMEN PELVIS W CONTRAST  Result Date: 06/08/2022 CLINICAL DATA:   Abdominal pain, acute, nonlocalized EXAM: CT ABDOMEN AND PELVIS WITH CONTRAST TECHNIQUE: Multidetector CT imaging of the abdomen and pelvis was performed using the standard protocol following bolus administration of intravenous contrast. RADIATION DOSE REDUCTION: This exam was performed according to the departmental dose-optimization program which includes automated exposure control, adjustment of the mA and/or kV according to patient size and/or use of iterative reconstruction technique. 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 Small hiatal hernia. Hepatobiliary: Stable scattered subcentimeter and pericentimeter hypodense hepatic lesions with largest measuring 2.2 x 1.7 cm with a density of 6 Hounsfield units. These likely represent simple hepatic cysts. No gallstones, gallbladder wall thickening, or pericholecystic fluid. No biliary dilatation. Pancreas: No focal lesion. Normal pancreatic contour. No surrounding inflammatory changes. No main pancreatic ductal dilatation. Spleen: Normal in size without focal abnormality. Adrenals/Urinary Tract: No adrenal nodule bilaterally. Bilateral kidneys enhance symmetrically. Punctate calcification within the left kidney. No ureterolithiasis. No right nephrolithiasis. No hydronephrosis. No hydroureter. The urinary bladder is  unremarkable. On delayed imaging, there is no urothelial wall thickening and there are no filling defects in the opacified portions of the bilateral collecting systems or ureters. Stomach/Bowel: Stomach is within normal limits. Irregular bowel wall thickening of the sigmoid colon extending approximately 9 cm in the craniocaudal dimension with associated gaseous and liquid stool dilatation of the proximal large bowel up to a caliber of 9 cm. No evidence of small bowel wall thickening or  dilatation. Appendix appears normal. Vascular/Lymphatic: No abdominal aorta or iliac aneurysm. Severe atherosclerotic plaque of the aorta and its branches. Multiple prominent but non-enlarged pericolonic lymph nodes along the rectum and sigmoid colon (2:65). No abdominal or inguinal lymphadenopathy. Reproductive: Prominent prostate measuring up to 4.6 cm Other: No intraperitoneal free fluid. No intraperitoneal free gas. No organized fluid collection. Musculoskeletal: No abdominal wall hernia or abnormality. No suspicious lytic or blastic osseous lesions. No acute displaced fracture. Multilevel degenerative changes of the spine. IMPRESSION: 1. Obstructive sigmoid mass extending approximately 9 cm in the craniocaudal dimension. Findings suggestive of malignancy. 2. High-grade large bowel obstruction with no findings of perforation. Recommend surgical consultation. 3. Multiple prominent but non-enlarged pericolonic lymph nodes along the rectum and sigmoid colon. Recommend attention on follow-up. 4. Colonic diverticulosis with no acute diverticulitis. 5. Nonobstructive punctate right nephrolithiasis. 6. Prominent prostate. 7. Small hiatal hernia. 8.  Aortic Atherosclerosis (ICD10-I70.0). Electronically Signed   By: Tish Frederickson M.D.   On: 06/08/2022 18:21    Scheduled Meds:  fentaNYL       fentaNYL       morphine   Intravenous Q4H    Continuous Infusions:   LOS: 1 day     Briant Cedar, MD [facility]  If 7PM-7AM, please contact night-coverage [email or web address] 06/09/2022, 3:19 PM

## 2022-06-09 NOTE — Anesthesia Preprocedure Evaluation (Addendum)
Anesthesia Evaluation  Patient identified by MRN, date of birth, ID band Patient awake    Reviewed: Allergy & Precautions, NPO status , Patient's Chart, lab work & pertinent test results  History of Anesthesia Complications Negative for: history of anesthetic complications  Airway Mallampati: II  TM Distance: >3 FB Neck ROM: Full    Dental  (+) Dental Advisory Given, Edentulous Upper   Pulmonary Current Smoker and Patient abstained from smoking.   Pulmonary exam normal        Cardiovascular negative cardio ROS Normal cardiovascular exam     Neuro/Psych negative neurological ROS     GI/Hepatic Neg liver ROS,,,SBO   Endo/Other  negative endocrine ROS    Renal/GU negative Renal ROS     Musculoskeletal negative musculoskeletal ROS (+)    Abdominal   Peds  Hematology negative hematology ROS (+)   Anesthesia Other Findings   Reproductive/Obstetrics                             Anesthesia Physical Anesthesia Plan  ASA: 3  Anesthesia Plan: General   Post-op Pain Management: Toradol IV (intra-op)* and Ofirmev IV (intra-op)*   Induction: Intravenous, Rapid sequence and Cricoid pressure planned  PONV Risk Score and Plan: 4 or greater and Ondansetron, Dexamethasone, Diphenhydramine and Treatment may vary due to age or medical condition  Airway Management Planned: Oral ETT  Additional Equipment: None  Intra-op Plan:   Post-operative Plan: Extubation in OR  Informed Consent: I have reviewed the patients History and Physical, chart, labs and discussed the procedure including the risks, benefits and alternatives for the proposed anesthesia with the patient or authorized representative who has indicated his/her understanding and acceptance.   Patient has DNR.  Discussed DNR with patient and Suspend DNR.   Dental advisory given  Plan Discussed with: Anesthesiologist and  CRNA  Anesthesia Plan Comments:        Anesthesia Quick Evaluation

## 2022-06-09 NOTE — Anesthesia Postprocedure Evaluation (Signed)
Anesthesia Post Note  Patient: BURNETT SISTARE  Procedure(s) Performed: EXPLORATORY LAPAROTOMY, HARTMANN PROCEDURE     Patient location during evaluation: PACU Anesthesia Type: General Level of consciousness: sedated Pain management: pain level controlled Vital Signs Assessment: post-procedure vital signs reviewed and stable Respiratory status: spontaneous breathing and respiratory function stable Cardiovascular status: stable Postop Assessment: no apparent nausea or vomiting Anesthetic complications: no  No notable events documented.  Last Vitals:  Vitals:   06/09/22 1330 06/09/22 1335  BP: (!) 145/84   Pulse: 71 78  Resp: 17 18  Temp:    SpO2: 98% 95%    Last Pain:  Vitals:   06/09/22 1335  TempSrc:   PainSc: 3                  Ola Fawver DANIEL

## 2022-06-09 NOTE — Transfer of Care (Signed)
Immediate Anesthesia Transfer of Care Note  Patient: Roger Mcdonald  Procedure(s) Performed: Procedure(s): EXPLORATORY LAPAROTOMY, HARTMANN PROCEDURE (N/A)  Patient Location: PACU  Anesthesia Type:General  Level of Consciousness: Alert, Awake, Oriented  Airway & Oxygen Therapy: Patient Spontanous Breathing  Post-op Assessment: Report given to RN  Post vital signs: Reviewed and stable  Last Vitals:  Vitals:   06/09/22 0936 06/09/22 0954  BP: 130/86 (!) 143/91  Pulse: 84 90  Resp: 20 16  Temp: 36.6 C 36.8 C  SpO2: 97% 96%    Complications: No apparent anesthesia complications

## 2022-06-09 NOTE — Anesthesia Procedure Notes (Signed)
Procedure Name: Intubation Date/Time: 06/09/2022 11:10 AM  Performed by: Basilio Cairo, CRNAPre-anesthesia Checklist: Patient identified, Patient being monitored, Timeout performed, Emergency Drugs available and Suction available Patient Re-evaluated:Patient Re-evaluated prior to induction Oxygen Delivery Method: Circle system utilized Preoxygenation: Pre-oxygenation with 100% oxygen Induction Type: IV induction and Rapid sequence Ventilation: Mask ventilation without difficulty Laryngoscope Size: Mac and 3 Grade View: Grade I Tube type: Oral Tube size: 7.5 mm Number of attempts: 1 Airway Equipment and Method: Stylet Placement Confirmation: ETT inserted through vocal cords under direct vision, positive ETCO2 and breath sounds checked- equal and bilateral Secured at: 22 cm Tube secured with: Tape Dental Injury: Teeth and Oropharynx as per pre-operative assessment

## 2022-06-09 NOTE — Progress Notes (Signed)
Pacu RN Report to floor given  Gave report to [employer]. Room: 1315   Discussed surgery, meds given in OR and Pacu, VS, IV fluids given, EBL, urine output, pain and other pertinent information. Also discussed if pt had any family or friends here or belongings with them.   Discussed pt's pain, pain pump needing to be set up, brought up tubing/PCA chamber to hook it up. Pt has a midline abdominal incision w/ tiny amount of BRB staining on incision line. The 1 piece colostomy bag was changed in Pacu, it was not sealing to skin and was leaking. Minimal BRB output in bag and on skin, approx 5ml. No NGT. No bowel sounds noted. Foley in place w/ dark yellow urine, 60 output.   In a bed w/ scd boots on. Texted his wife, no answer. He will try to call his son later when he has access to his cell phone.   Pt exits my care.

## 2022-06-09 NOTE — Progress Notes (Signed)
Placed call to security department, in regards to the pt having brought pocket knife from [facility]. [facility] staff had given this to the nurse to keep. Asked security if they were to keep this item. Staff stated that we can keep on unit in a safe place, away from patient, until discharged. Placed pocket knife in drawer at nurses station.

## 2022-06-09 NOTE — H&P (Signed)
CC: abd pain and bloating, LBO  Requesting provider: Dr Sharolyn Douglas  HPI: Roger Mcdonald is an 72 y.o. male who is here for worsening abd pain.  He states that for the past 2 months, he has had worsening gas and distention.  He has been having BM's.  No sig PMH.   NG attempted last night but pt did not tolerate this well  History reviewed. No pertinent past medical history.  Past Surgical History:  Procedure Laterality Date   CERVICAL SPINE SURGERY     TONSILLECTOMY      History reviewed. No pertinent family history.  Social:  reports that he has been smoking cigarettes. He has never used smokeless tobacco. He reports that he does not currently use alcohol. He reports that he does not currently use drugs.  Allergies: No Known Allergies  Medications: I have reviewed the patient's current medications.  Results for orders placed or performed during the hospital encounter of 06/08/22 (from the past 48 hour(s))  CBC with Differential     Status: Abnormal   Collection Time: 06/08/22  3:56 PM  Result Value Ref Range   WBC 11.5 (H) 4.0 - 10.5 K/uL   RBC 5.10 4.22 - 5.81 MIL/uL   Hemoglobin 15.0 13.0 - 17.0 g/dL   HCT 16.1 09.6 - 04.5 %   MCV 88.0 80.0 - 100.0 fL   MCH 29.4 26.0 - 34.0 pg   MCHC 33.4 30.0 - 36.0 g/dL   RDW 40.9 81.1 - 91.4 %   Platelets 299 150 - 400 K/uL   nRBC 0.0 0.0 - 0.2 %   Neutrophils Relative % 72 %   Neutro Abs 8.3 (H) 1.7 - 7.7 K/uL   Lymphocytes Relative 14 %   Lymphs Abs 1.6 0.7 - 4.0 K/uL   Monocytes Relative 9 %   Monocytes Absolute 1.0 0.1 - 1.0 K/uL   Eosinophils Relative 4 %   Eosinophils Absolute 0.4 0.0 - 0.5 K/uL   Basophils Relative 1 %   Basophils Absolute 0.1 0.0 - 0.1 K/uL   Immature Granulocytes 0 %   Abs Immature Granulocytes 0.04 0.00 - 0.07 K/uL    Comment: Performed at [facility], [address] [place], [place] [address]  Urinalysis, Routine w reflex microscopic -Urine, Clean Catch     Status: Abnormal    Collection Time: 06/08/22  3:57 PM  Result Value Ref Range   Color, Urine YELLOW YELLOW   APPearance CLEAR CLEAR   Specific Gravity, Urine >=1.030 1.005 - 1.030   pH 5.5 5.0 - 8.0   Glucose, UA NEGATIVE NEGATIVE mg/dL   Hgb urine dipstick NEGATIVE NEGATIVE   Bilirubin Urine SMALL (A) NEGATIVE   Ketones, ur NEGATIVE NEGATIVE mg/dL   Protein, ur 30 (A) NEGATIVE mg/dL   Nitrite NEGATIVE NEGATIVE   Leukocytes,Ua NEGATIVE NEGATIVE    Comment: Performed at [facility], [address] [place], [place] [address]  Urinalysis, Microscopic (reflex)     Status: Abnormal   Collection Time: 06/08/22  3:57 PM  Result Value Ref Range   RBC / HPF 0-5 0 - 5 RBC/hpf   WBC, UA 0-5 0 - 5 WBC/hpf   Bacteria, UA RARE (A) NONE SEEN   Squamous Epithelial / HPF 0-5 0 - 5 /HPF   Mucus PRESENT     Comment: Performed at [facility], [address] [place], [place] [address]  Comprehensive metabolic panel     Status:  Abnormal   Collection Time: 06/08/22  4:40 PM  Result Value Ref Range   Sodium 136 135 - 145 mmol/L   Potassium 3.6 3.5 - 5.1 mmol/L   Chloride 105 98 - 111 mmol/L   CO2 23 22 - 32 mmol/L   Glucose, Bld 114 (H) 70 - 99 mg/dL    Comment: Glucose reference range applies only to samples taken after fasting for at least 8 hours.   BUN 13 8 - 23 mg/dL   Creatinine, Ser 1.61 0.61 - 1.24 mg/dL   Calcium 8.0 (L) 8.9 - 10.3 mg/dL   Total Protein 6.7 6.5 - 8.1 g/dL   Albumin 3.4 (L) 3.5 - 5.0 g/dL   AST 20 15 - 41 U/L   ALT 19 0 - 44 U/L   Alkaline Phosphatase 55 38 - 126 U/L   Total Bilirubin 0.8 0.3 - 1.2 mg/dL   GFR, Estimated >09 >60 mL/min    Comment: (NOTE) Calculated using the CKD-EPI Creatinine Equation (2021)    Anion gap 8 5 - 15    Comment: Performed at [facility], [address] [place], [place] [address]  Lipase, blood     Status: None   Collection Time: 06/08/22  4:40 PM  Result Value Ref Range   Lipase 31 11 - 51 U/L    Comment: Performed  at [facility], [address] [place], [place] [address]  CBC     Status: Abnormal   Collection Time: 06/09/22  4:02 AM  Result Value Ref Range   WBC 11.4 (H) 4.0 - 10.5 K/uL   RBC 5.39 4.22 - 5.81 MIL/uL   Hemoglobin 15.9 13.0 - 17.0 g/dL   HCT 47.8 29.5 - 62.1 %   MCV 89.8 80.0 - 100.0 fL   MCH 29.5 26.0 - 34.0 pg   MCHC 32.9 30.0 - 36.0 g/dL   RDW 30.8 65.7 - 84.6 %   Platelets 278 150 - 400 K/uL   nRBC 0.0 0.0 - 0.2 %    Comment: Performed at [facility], [address] [place], [place] [address]  Basic metabolic panel     Status: Abnormal   Collection Time: 06/09/22  4:02 AM  Result Value Ref Range   Sodium 136 135 - 145 mmol/L   Potassium 3.1 (L) 3.5 - 5.1 mmol/L   Chloride 106 98 - 111 mmol/L   CO2 19 (L) 22 - 32 mmol/L   Glucose, Bld 123 (H) 70 - 99 mg/dL    Comment: Glucose reference range applies only to samples taken after fasting for at least 8 hours.   BUN 9 8 - 23 mg/dL   Creatinine, Ser 2.84 0.61 - 1.24 mg/dL   Calcium 8.1 (L) 8.9 - 10.3 mg/dL   GFR, Estimated >13 >24 mL/min    Comment: (NOTE) Calculated using the CKD-EPI Creatinine Equation (2021)    Anion gap 11 5 - 15    Comment: Performed at [facility], [address], [place], [place] [address]  Troponin I (High Sensitivity)     Status: None   Collection Time: 06/09/22  4:02 AM  Result Value Ref Range   Troponin I (High Sensitivity) 3 <18 ng/L    Comment: (NOTE) Elevated high sensitivity troponin I (hsTnI) values and significant  changes across serial measurements may suggest ACS but many other  chronic and acute conditions are known to elevate hsTnI results.  Refer to the "Links" section for  chest pain algorithms and additional  guidance. Performed at [facility], [address] [place], [place] [address]     DG Abd Portable 1V  Result Date: 06/08/2022 CLINICAL DATA:  NG tube placement EXAM: PORTABLE ABDOMEN - 1 VIEW  COMPARISON:  CT today FINDINGS: NG tube tip is in the proximal stomach near the GE junction. Side port is in the distal esophagus. Gaseous distention of the visualized colon. Visualized lung bases clear. IMPRESSION: NG tube tip near the GE junction with the side port in the distal esophagus. This could be advanced several cm for optimal positioning. Electronically Signed   By: Charlett Nose M.D.   On: 06/08/2022 19:21   CT ABDOMEN PELVIS W CONTRAST  Result Date: 06/08/2022 CLINICAL DATA:  Abdominal pain, acute, nonlocalized EXAM: CT ABDOMEN AND PELVIS WITH CONTRAST TECHNIQUE: Multidetector CT imaging of the abdomen and pelvis was performed using the standard protocol following bolus administration of intravenous contrast. RADIATION DOSE REDUCTION: This exam was performed according to the departmental dose-optimization program which includes automated exposure control, adjustment of the mA and/or kV according to patient size and/or use of iterative reconstruction technique. 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 Small hiatal hernia. Hepatobiliary: Stable scattered subcentimeter and pericentimeter hypodense hepatic lesions with largest measuring 2.2 x 1.7 cm with a density of 6 Hounsfield units. These likely represent simple hepatic cysts. No gallstones, gallbladder wall thickening, or pericholecystic fluid. No biliary dilatation. Pancreas: No focal lesion. Normal pancreatic contour. No surrounding inflammatory changes. No main pancreatic ductal dilatation. Spleen: Normal in size without focal abnormality. Adrenals/Urinary Tract: No adrenal nodule bilaterally. Bilateral kidneys enhance symmetrically. Punctate calcification within the left kidney. No ureterolithiasis. No right nephrolithiasis. No hydronephrosis. No hydroureter. The urinary bladder is  unremarkable. On delayed imaging, there is no urothelial wall thickening and there are no filling defects in the opacified portions of the bilateral collecting systems or ureters. Stomach/Bowel: Stomach is within normal limits. Irregular bowel wall thickening of the sigmoid colon extending approximately 9 cm in the craniocaudal dimension with associated gaseous and liquid stool dilatation of the proximal large bowel up to a caliber of 9 cm. No evidence of small bowel wall thickening or dilatation. Appendix appears normal. Vascular/Lymphatic: No abdominal aorta or iliac aneurysm. Severe atherosclerotic plaque of the aorta and its branches. Multiple prominent but non-enlarged pericolonic lymph nodes along the rectum and sigmoid colon (2:65). No abdominal or inguinal lymphadenopathy. Reproductive: Prominent prostate measuring up to 4.6 cm Other: No intraperitoneal free fluid. No intraperitoneal free gas. No organized fluid collection. Musculoskeletal: No abdominal wall hernia or abnormality. No suspicious lytic or blastic osseous lesions. No acute displaced fracture. Multilevel degenerative changes of the spine. IMPRESSION: 1. Obstructive sigmoid mass extending approximately 9 cm in the craniocaudal dimension. Findings suggestive of malignancy. 2. High-grade large bowel obstruction with no findings of perforation. Recommend surgical consultation. 3. Multiple prominent but non-enlarged pericolonic lymph nodes along the rectum and sigmoid colon. Recommend attention on follow-up. 4. Colonic diverticulosis with no acute diverticulitis. 5. Nonobstructive punctate right nephrolithiasis. 6. Prominent prostate. 7. Small hiatal hernia. 8.  Aortic Atherosclerosis (ICD10-I70.0). Electronically Signed   By: Tish Frederickson M.D.   On: 06/08/2022 18:21    ROS - all of the below systems have been reviewed with the patient and positives are indicated with bold text General: chills, fever or night sweats Eyes: blurry vision  or  double vision ENT: epistaxis or sore throat Hematologic/Lymphatic: bleeding problems, blood clots or swollen lymph nodes Endocrine: temperature intolerance or unexpected weight changes Breast: new or changing breast lumps or nipple discharge Resp: cough, shortness of breath, or wheezing CV: chest pain or dyspnea on exertion GI: as per HPI GU: dysuria, trouble voiding, or hematuria Neuro: TIA or stroke symptoms    PE Blood pressure 132/78, pulse 91, temperature 97.8 F (36.6 C), temperature source Oral, resp. rate 19, height 5\' 7"  (1.702 m), weight 99.2 kg, SpO2 97 %. Constitutional: NAD; conversant; no deformities Eyes: Moist conjunctiva; no lid lag; anicteric; PERRL Neck: Trachea midline; no thyromegaly Lungs: Normal respiratory effort CV: RRR GI: Abd distended, soft MSK: Normal range of motion of extremities; no clubbing/cyanosis Psychiatric: Appropriate affect; alert and oriented x3  Results for orders placed or performed during the hospital encounter of 06/08/22 (from the past 48 hour(s))  CBC with Differential     Status: Abnormal   Collection Time: 06/08/22  3:56 PM  Result Value Ref Range   WBC 11.5 (H) 4.0 - 10.5 K/uL   RBC 5.10 4.22 - 5.81 MIL/uL   Hemoglobin 15.0 13.0 - 17.0 g/dL   HCT 91.4 78.2 - 95.6 %   MCV 88.0 80.0 - 100.0 fL   MCH 29.4 26.0 - 34.0 pg   MCHC 33.4 30.0 - 36.0 g/dL   RDW 21.3 08.6 - 57.8 %   Platelets 299 150 - 400 K/uL   nRBC 0.0 0.0 - 0.2 %   Neutrophils Relative % 72 %   Neutro Abs 8.3 (H) 1.7 - 7.7 K/uL   Lymphocytes Relative 14 %   Lymphs Abs 1.6 0.7 - 4.0 K/uL   Monocytes Relative 9 %   Monocytes Absolute 1.0 0.1 - 1.0 K/uL   Eosinophils Relative 4 %   Eosinophils Absolute 0.4 0.0 - 0.5 K/uL   Basophils Relative 1 %   Basophils Absolute 0.1 0.0 - 0.1 K/uL   Immature Granulocytes 0 %   Abs Immature Granulocytes 0.04 0.00 - 0.07 K/uL    Comment: Performed at [facility], [address] [place], [place] [address]   Urinalysis, Routine w reflex microscopic -Urine, Clean Catch     Status: Abnormal   Collection Time: 06/08/22  3:57 PM  Result Value Ref Range   Color, Urine YELLOW YELLOW   APPearance CLEAR CLEAR   Specific Gravity, Urine >=1.030 1.005 - 1.030   pH 5.5 5.0 - 8.0   Glucose, UA NEGATIVE NEGATIVE mg/dL   Hgb urine dipstick NEGATIVE NEGATIVE   Bilirubin Urine SMALL (A) NEGATIVE   Ketones, ur NEGATIVE NEGATIVE mg/dL   Protein, ur 30 (A) NEGATIVE mg/dL   Nitrite NEGATIVE NEGATIVE   Leukocytes,Ua NEGATIVE NEGATIVE    Comment: Performed at [facility], [address] [place], [place] [address]  Urinalysis, Microscopic (reflex)     Status: Abnormal   Collection Time: 06/08/22  3:57 PM  Result Value Ref Range   RBC / HPF 0-5 0 - 5 RBC/hpf   WBC, UA 0-5 0 - 5 WBC/hpf   Bacteria, UA RARE (A) NONE SEEN   Squamous Epithelial / HPF 0-5 0 - 5 /HPF   Mucus PRESENT     Comment: Performed at [facility], [address] [place], [place] [address]  Comprehensive metabolic panel     Status: Abnormal   Collection Time: 06/08/22  4:40 PM  Result Value Ref Range  Sodium 136 135 - 145 mmol/L   Potassium 3.6 3.5 - 5.1 mmol/L   Chloride 105 98 - 111 mmol/L   CO2 23 22 - 32 mmol/L   Glucose, Bld 114 (H) 70 - 99 mg/dL    Comment: Glucose reference range applies only to samples taken after fasting for at least 8 hours.   BUN 13 8 - 23 mg/dL   Creatinine, Ser 1.61 0.61 - 1.24 mg/dL   Calcium 8.0 (L) 8.9 - 10.3 mg/dL   Total Protein 6.7 6.5 - 8.1 g/dL   Albumin 3.4 (L) 3.5 - 5.0 g/dL   AST 20 15 - 41 U/L   ALT 19 0 - 44 U/L   Alkaline Phosphatase 55 38 - 126 U/L   Total Bilirubin 0.8 0.3 - 1.2 mg/dL   GFR, Estimated >09 >60 mL/min    Comment: (NOTE) Calculated using the CKD-EPI Creatinine Equation (2021)    Anion gap 8 5 - 15    Comment: Performed at [facility], [address] [place], [place] [address]  Lipase, blood     Status: None   Collection Time:  06/08/22  4:40 PM  Result Value Ref Range   Lipase 31 11 - 51 U/L    Comment: Performed at [facility], [address] [place], [place] [address]  CBC     Status: Abnormal   Collection Time: 06/09/22  4:02 AM  Result Value Ref Range   WBC 11.4 (H) 4.0 - 10.5 K/uL   RBC 5.39 4.22 - 5.81 MIL/uL   Hemoglobin 15.9 13.0 - 17.0 g/dL   HCT 47.8 29.5 - 62.1 %   MCV 89.8 80.0 - 100.0 fL   MCH 29.5 26.0 - 34.0 pg   MCHC 32.9 30.0 - 36.0 g/dL   RDW 30.8 65.7 - 84.6 %   Platelets 278 150 - 400 K/uL   nRBC 0.0 0.0 - 0.2 %    Comment: Performed at [facility], [address] [place], [place] [address]  Basic metabolic panel     Status: Abnormal   Collection Time: 06/09/22  4:02 AM  Result Value Ref Range   Sodium 136 135 - 145 mmol/L   Potassium 3.1 (L) 3.5 - 5.1 mmol/L   Chloride 106 98 - 111 mmol/L   CO2 19 (L) 22 - 32 mmol/L   Glucose, Bld 123 (H) 70 - 99 mg/dL    Comment: Glucose reference range applies only to samples taken after fasting for at least 8 hours.   BUN 9 8 - 23 mg/dL   Creatinine, Ser 2.84 0.61 - 1.24 mg/dL   Calcium 8.1 (L) 8.9 - 10.3 mg/dL   GFR, Estimated >13 >24 mL/min    Comment: (NOTE) Calculated using the CKD-EPI Creatinine Equation (2021)    Anion gap 11 5 - 15    Comment: Performed at [facility], [address], [place], [place] [address]  Troponin I (High Sensitivity)     Status: None   Collection Time: 06/09/22  4:02 AM  Result Value Ref Range   Troponin I (High Sensitivity) 3 <18 ng/L    Comment: (NOTE) Elevated high sensitivity troponin I (hsTnI) values and significant  changes across serial measurements may suggest ACS but many other  chronic and acute conditions are known to elevate hsTnI results.  Refer to the "Links" section for chest pain algorithms and additional  guidance. Performed at [facility], [address] [place], [place] [address]     DG Abd Portable  1V  Result Date: 06/08/2022 CLINICAL DATA:  NG tube placement EXAM: PORTABLE ABDOMEN - 1 VIEW COMPARISON:  CT today FINDINGS: NG tube tip is in the proximal stomach near the GE junction. Side port is in the distal esophagus. Gaseous distention of the visualized colon. Visualized lung bases clear. IMPRESSION: NG tube tip near the GE junction with the side port in the distal esophagus. This could be advanced several cm for optimal positioning. Electronically Signed   By: Charlett Nose M.D.   On: 06/08/2022 19:21   CT ABDOMEN PELVIS W CONTRAST  Result Date: 06/08/2022 CLINICAL DATA:  Abdominal pain, acute, nonlocalized EXAM: CT ABDOMEN AND PELVIS WITH CONTRAST TECHNIQUE: Multidetector CT imaging of the abdomen and pelvis was performed using the standard protocol following bolus administration of intravenous contrast. RADIATION DOSE REDUCTION: This exam was performed according to the departmental dose-optimization program which includes automated exposure control, adjustment of the mA and/or kV according to patient size and/or use of iterative reconstruction technique. 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 Small hiatal hernia. Hepatobiliary: Stable scattered subcentimeter and pericentimeter hypodense hepatic lesions with largest measuring 2.2 x 1.7 cm with a density of 6 Hounsfield units. These likely represent simple hepatic cysts. No gallstones, gallbladder wall thickening, or pericholecystic fluid. No biliary dilatation. Pancreas: No focal lesion. Normal pancreatic contour. No surrounding inflammatory changes. No main pancreatic ductal dilatation. Spleen: Normal in size without focal abnormality. Adrenals/Urinary Tract: No adrenal nodule bilaterally. Bilateral kidneys enhance symmetrically. Punctate calcification within the left kidney. No  ureterolithiasis. No right nephrolithiasis. No hydronephrosis. No hydroureter. The urinary bladder is unremarkable. On delayed imaging, there is no urothelial wall thickening and there are no filling defects in the opacified portions of the bilateral collecting systems or ureters. Stomach/Bowel: Stomach is within normal limits. Irregular bowel wall thickening of the sigmoid colon extending approximately 9 cm in the craniocaudal dimension with associated gaseous and liquid stool dilatation of the proximal large bowel up to a caliber of 9 cm. No evidence of small bowel wall thickening or dilatation. Appendix appears normal. Vascular/Lymphatic: No abdominal aorta or iliac aneurysm. Severe atherosclerotic plaque of the aorta and its branches. Multiple prominent but non-enlarged pericolonic lymph nodes along the rectum and sigmoid colon (2:65). No abdominal or inguinal lymphadenopathy. Reproductive: Prominent prostate measuring up to 4.6 cm Other: No intraperitoneal free fluid. No intraperitoneal free gas. No organized fluid collection. Musculoskeletal: No abdominal wall hernia or abnormality. No suspicious lytic or blastic osseous lesions. No acute displaced fracture. Multilevel degenerative changes of the spine. IMPRESSION: 1. Obstructive sigmoid mass extending approximately 9 cm in the craniocaudal dimension. Findings suggestive of malignancy. 2. High-grade large bowel obstruction with no findings of perforation. Recommend surgical consultation. 3. Multiple prominent but non-enlarged pericolonic lymph nodes along the rectum and sigmoid colon. Recommend attention on follow-up. 4. Colonic diverticulosis with no acute diverticulitis. 5. Nonobstructive punctate right nephrolithiasis. 6. Prominent prostate. 7. Small hiatal hernia. 8.  Aortic Atherosclerosis (ICD10-I70.0). Electronically Signed   By: Tish Frederickson M.D.   On: 06/08/2022 18:21     A/P: Roger Mcdonald is an 72 y.o. male with LBO without SBO, which appears  to be due to Sigmoid tumor.  CT images reviewed.  Pt is significant pain without signs of perforation.  Plan for urgent laparotomy and Hartman's procedure today  by Dr Magnus Ivan.      Vanita Panda, MD  Colorectal and General Surgery [facility]    high medical decision making.

## 2022-06-09 NOTE — Progress Notes (Signed)
Making rounds this am, noted pt w blue emesis bag actively retching repeatedly. Review of previous CXR for tube placement = tube in esophagus. Attempted to advance tube and pt became very upset and demanded that the NG tube be removed, that he had "had enough of it!" CCS notified of above. Pt remains NPO

## 2022-06-09 NOTE — Op Note (Signed)
Roger Mcdonald 06/08/2022 - 06/09/2022   Pre-op Diagnosis: OBSTRUCTIVE SIGMOID COLON CANCER     Post-op Diagnosis: SAME  Procedure(s): EXPLORATORY LAPAROTOMY HARTMANN PROCEDURE  Surgeon(s): Abigail Miyamoto, MD Griselda Miner, MD   MD assistant was required secondary to complex procedure, morbid obesity for exposure and dissection  Anesthesia: General  Staff:  Circulator: Silas [place], RN Scrub Person: Bethann Goo  Estimated Blood Loss: Minimal               Specimens: SENT TO PATH   Indications: This is a 72 year old gentleman who presented with abdominal distention and abdominal pain.  He was found on CT scan to have what appeared to be an instructing mass in his sigmoid colon.  Because of the distention of the proximal colon, his pain, and tenderness on examination, the decision was made to proceed urgently to the operating room  Findings: The patient is found to have an obstructing mass in the sigmoid colon.  The proximal colon was massively dilated.  A sigmoid colectomy and colostomy was performed (Hartman's procedure)  Procedure: The patient was brought to the operating identified as the correct patient.  He was placed upon the operating table and general anesthesia was induced.  His abdomen was prepped and draped in the usual sterile fashion.  I created a midline incision from just above the umbilicus to the pubis with a scalpel.  We then took this down through the subcutaneous tissue to the fascia.  We then opened the fascia and peritoneum the total length of the incision.  Upon entering the abdomen the patient was found to have a very dilated colon.  The mass in the sigmoid colon was easily palpable going down to the peritoneal reflection.  I had to free up some small bowel loops adhesed to the side of the colon with the Metzenbaum scissors.  I then transected the descending colon proximal to the sigmoid colon with a GIA 100 stapler.  We then took down the  mesentery with the LigaSure.  We then dissected toward the pelvis.  The peritoneal reflection was adherent anteriorly and were able to take this down with the cautery.  I was then able to identify sigmoid colon distal to the palpable mass.  I was then able to transect this with the contour stapler.  The rest of the mesentery was then taken down with the LigaSure device completing the partial colectomy of the sigmoid colon.  I marked the distal specimen staple line with a silk suture.  We controlled several bleeding areas in the mesentery with 2-0 silk sutures.  We then placed a 3-0 silk suture at the center of the rectal stump.  This was done for marking purposes.  We then copiously irrigated the pelvis with saline.  Hemostasis appeared to be achieved.  I then mobilized the descending colon along the white line of Toldt.  We made a separate skin incision with the cautery in a circular shape in the patient's left lower quadrant.  We took this down to the fascia which was then opened in a cruciate fashion.  The underlying muscle fibers were dissected bluntly and the peritoneum was opened.  The descending colon was then pulled out of this separate incision as the end colostomy.  Again, hemostasis appeared to be achieved.  The midline fascia was then closed with a running #1 looped PDS suture.  The skin was then irrigated and closed with skin staples.  I opened up part of the  staple line with the cautery at the ostomy site.  The colostomy was then matured with interrupted 3-0 Vicryl sutures.  The ostomy appeared pink and well-perfused.  Honeycomb gauze was placed to midline incision and an ostomy appliance was applied.  The patient tolerated the procedure well.  All the counts were correct at the end of the procedure.  The patient was then extubated in the operating room and taken in stable condition to the recovery room.           Abigail Miyamoto   Date: 06/09/2022  Time: 12:24 PM

## 2022-06-09 NOTE — Progress Notes (Signed)
Patient ID: Roger Mcdonald, male   DOB: [birth year], 72 y.o.   MRN: [id number]   Pre Procedure note for inpatients:   Roger Mcdonald has been scheduled for Procedure(s): EXPLORATORY LAPAROTOMY, PARTIAL COLECTOMY, COLOSTOMY (N/A) today. The various methods of treatment have been discussed with the patient. After consideration of the risks, benefits and treatment options the patient has consented to the planned procedure.   The patient has been seen and labs reviewed. There are no changes in the patient's condition to prevent proceeding with the planned procedure today.  Recent labs:  Lab Results  Component Value Date   WBC 11.4 (H) 06/09/2022   HGB 15.9 06/09/2022   HCT 48.4 06/09/2022   PLT 278 06/09/2022   GLUCOSE 123 (H) 06/09/2022   ALT 19 06/08/2022   AST 20 06/08/2022   NA 136 06/09/2022   K 3.1 (L) 06/09/2022   CL 106 06/09/2022   CREATININE 0.90 06/09/2022   BUN 9 06/09/2022   CO2 19 (L) 06/09/2022    Abigail Miyamoto, MD 06/09/2022 10:14 AM

## 2022-06-10 ENCOUNTER — Encounter (HOSPITAL_COMMUNITY): Payer: Self-pay | Admitting: Surgery

## 2022-06-10 DIAGNOSIS — K56609 Unspecified intestinal obstruction, unspecified as to partial versus complete obstruction: Secondary | ICD-10-CM | POA: Diagnosis not present

## 2022-06-10 LAB — BASIC METABOLIC PANEL
Anion gap: 8 (ref 5–15)
BUN: 9 mg/dL (ref 8–23)
CO2: 24 mmol/L (ref 22–32)
Calcium: 8 mg/dL — ABNORMAL LOW (ref 8.9–10.3)
Chloride: 101 mmol/L (ref 98–111)
Creatinine, Ser: 0.77 mg/dL (ref 0.61–1.24)
GFR, Estimated: >60 mL/min (ref >60)
Glucose, Bld: 98 mg/dL (ref 70–99)
Potassium: 4.4 mmol/L (ref 3.5–5.1)
Sodium: 133 mmol/L — ABNORMAL LOW (ref 135–145)

## 2022-06-10 LAB — CBC
HCT: 43.6 % (ref 39.0–52.0)
Hemoglobin: 14.4 g/dL (ref 13.0–17.0)
MCH: 29.4 pg (ref 26.0–34.0)
MCHC: 33 g/dL (ref 30.0–36.0)
MCV: 89.2 fL (ref 80.0–100.0)
Platelets: 289 10*3/uL (ref 150–400)
RBC: 4.89 MIL/uL (ref 4.22–5.81)
RDW: 13.2 % (ref 11.5–15.5)
WBC: 14.9 10*3/uL — ABNORMAL HIGH (ref 4.0–10.5)
nRBC: 0.1 % (ref 0.0–0.2)

## 2022-06-10 MED ORDER — SODIUM CHLORIDE 0.9 % IV SOLN: INTRAVENOUS | Status: DC

## 2022-06-10 MED ORDER — ACETAMINOPHEN 325 MG PO TABS
650.0000 mg | ORAL_TABLET | Freq: Four times a day (QID) | ORAL | Status: DC
  Administered 2022-06-10 – 2022-06-11 (×4): 650 mg via ORAL
  Filled 2022-06-10 (×4): qty 2

## 2022-06-10 MED ORDER — ACETAMINOPHEN 650 MG RE SUPP: 650.0000 mg | Freq: Four times a day (QID) | RECTAL | Status: DC

## 2022-06-10 MED ORDER — PANTOPRAZOLE SODIUM 40 MG PO TBEC
40.0000 mg | DELAYED_RELEASE_TABLET | Freq: Every day | ORAL | Status: DC
  Administered 2022-06-11 – 2022-06-13 (×2): 40 mg via ORAL
  Filled 2022-06-10 (×2): qty 1

## 2022-06-10 MED ORDER — ENOXAPARIN SODIUM 40 MG/0.4ML IJ SOSY
40.0000 mg | PREFILLED_SYRINGE | INTRAMUSCULAR | Status: DC
  Administered 2022-06-10 – 2022-06-12 (×3): 40 mg via SUBCUTANEOUS
  Filled 2022-06-10 (×3): qty 0.4

## 2022-06-10 MED ORDER — CALCIUM CARBONATE ANTACID 500 MG PO CHEW
1.0000 | CHEWABLE_TABLET | Freq: Every day | ORAL | Status: DC | PRN
  Administered 2022-06-10: 200 mg via ORAL
  Filled 2022-06-10: qty 1

## 2022-06-10 MED ORDER — CHLORHEXIDINE GLUCONATE CLOTH 2 % EX PADS
6.0000 | MEDICATED_PAD | Freq: Every day | CUTANEOUS | Status: DC
  Administered 2022-06-10 – 2022-06-11 (×2): 6 via TOPICAL

## 2022-06-10 MED ORDER — LIDOCAINE 5 % EX PTCH
1.0000 | MEDICATED_PATCH | CUTANEOUS | Status: DC
  Administered 2022-06-10 – 2022-06-12 (×3): 1 via TRANSDERMAL
  Filled 2022-06-10 (×4): qty 1

## 2022-06-10 NOTE — Evaluation (Signed)
Physical Therapy Evaluation Patient Details Name: Roger Mcdonald MRN: [id number] DOB: [birth year] Today's Date: 06/10/2022  History of Present Illness  Roger Mcdonald is a 72 y.o. male with no significant past medical history and surgical history of C-spine surgery and tonsillectomy presented to the ED 06/08/22  with abdominal pain. CT-Obstructive sigmoid mass ,suggestive of malignancy,  High-grade large bowel obstruction. S/P E XPLORATORY LAPAROTOMY, PARTIAL COLECTOMY, COLOSTOMY on 06/07/22  Clinical Impression  Pt admitted with above diagnosis.  Pt currently with functional limitations due to the deficits listed below (see PT Problem List). Pt will benefit from acute skilled PT to increase their independence and safety with mobility to allow discharge.     The patient required extensive assistance to move to sitting, did not tolerate sitting  due to reports of severe  abdominal pain and returned to supine. Patient  again assisted to sitting and stood up immediately. Patient tolerated  ambulating x 30' with RW.  Patient should progress to return home with family support.     Recommendations for follow up therapy are one component of a multi-disciplinary discharge planning process, led by the attending physician.  Recommendations may be updated based on patient status, additional functional criteria and insurance authorization.  Follow Up Recommendations       Assistance Recommended at Discharge Set up Supervision/Assistance  Patient can return home with the following  A little help with bathing/dressing/bathroom;Help with stairs or ramp for entrance;Assistance with cooking/housework;Assist for transportation    Equipment Recommendations None recommended by PT  Recommendations for Other Services       Functional Status Assessment Patient has had a recent decline in their functional status and demonstrates the ability to make significant improvements in function in a reasonable and predictable  amount of time.     Precautions / Restrictions Precautions Precaution Comments: colostomy, abdominal  wound      Mobility  Bed Mobility Overal bed mobility: Needs Assistance Bed Mobility: Rolling, Sidelying to Sit, Sit to Supine Rolling: Mod assist Sidelying to sit: Max assist, +2 for safety/equipment, +2 for physical assistance   Sit to supine: Min assist   General bed mobility comments: patient  assisted to sitting with HOB raised and a encouraged log roll, max support to sit upright, immediately returned to supine with complaints of pain. Patient assisted again to sit up and immediately stood up    Transfers Overall transfer level: Needs assistance Equipment used: Rolling walker (2 wheels) Transfers: Sit to/from Stand Sit to Stand: Min assist                Ambulation/Gait Ambulation/Gait assistance: Editor, commissioning (Feet): 30 Feet Assistive device: Rolling walker (2 wheels) Gait Pattern/deviations: Step-through pattern       General Gait Details: gait slow and steady and guarded  Stairs            Wheelchair Mobility    Modified Rankin (Stroke Patients Only)       Balance Overall balance assessment: Mild deficits observed, not formally tested                                           Pertinent Vitals/Pain Pain Assessment Pain Assessment: 0-10 Pain Score: 10-Worst pain ever Pain Location: abdomen when attempting to sit up, 3 prior, used PCA x 2 Pain Descriptors / Indicators: Cramping, Crushing, Guarding, Grimacing, Moaning, Jabbing Pain Intervention(s): Limited  activity within patient's tolerance, Monitored during session, Premedicated before session, PCA encouraged    Home Living Family/patient expects to be discharged to:: Private residence Living Arrangements: Spouse/significant other;Other relatives Available Help at Discharge: Family;Available 24 hours/day Type of Home: House         Home Layout: One  level Home Equipment: None      Prior Function Prior Level of Function : Independent/Modified Independent             Mobility Comments: just  finished a long motorcycle ride       Hand Dominance        Extremity/Trunk Assessment   Upper Extremity Assessment Upper Extremity Assessment: Overall WFL for tasks assessed    Lower Extremity Assessment Lower Extremity Assessment: Overall WFL for tasks assessed    Cervical / Trunk Assessment Cervical / Trunk Assessment: Other exceptions Cervical / Trunk Exceptions: abdominal surgery  Communication      Cognition Arousal/Alertness: Awake/alert Behavior During Therapy: WFL for tasks assessed/performed Overall Cognitive Status: Within Functional Limits for tasks assessed                                          General Comments      Exercises     Assessment/Plan    PT Assessment Patient needs continued PT services  PT Problem List Decreased strength;Decreased activity tolerance;Decreased mobility;Decreased safety awareness;Decreased knowledge of precautions;Decreased range of motion       PT Treatment Interventions DME instruction;Therapeutic activities;Gait training;Functional mobility training;Therapeutic exercise;Patient/family education    PT Goals (Current goals can be found in the Care Plan section)  Acute Rehab PT Goals Patient Stated Goal: to go home PT Goal Formulation: With patient Time For Goal Achievement: 06/24/22 Potential to Achieve Goals: Good    Frequency Min 1X/week     Co-evaluation               AM-PAC PT "6 Clicks" Mobility  Outcome Measure Help needed turning from your back to your side while in a flat bed without using bedrails?: A Lot Help needed moving from lying on your back to sitting on the side of a flat bed without using bedrails?: A Lot Help needed moving to and from a bed to a chair (including a wheelchair)?: A Lot Help needed standing up from a chair  using your arms (e.g., wheelchair or bedside chair)?: A Little Help needed to walk in hospital room?: A Little Help needed climbing 3-5 steps with a railing? : A Lot 6 Click Score: 14    End of Session   Activity Tolerance: Patient tolerated treatment well;Patient limited by pain Patient left: in chair;with chair alarm set;with call bell/phone within reach Nurse Communication: Mobility status PT Visit Diagnosis: Unsteadiness on feet (R26.81);Difficulty in walking, not elsewhere classified (R26.2);Pain    Time: 6045-4098 PT Time Calculation (min) (ACUTE ONLY): 28 min   Charges:   PT Evaluation $PT Eval Low Complexity: 1 Low PT Treatments $Gait Training: 8-22 mins        Blanchard Kelch PT Acute Rehabilitation Services Office [phone] Weekend [phone]   Rada Hay 06/10/2022, 12:40 PM

## 2022-06-10 NOTE — Progress Notes (Signed)
[facility] Progress Note  1 Day Post-Op  Subjective: CC:  Reports some abdominal discomfort, slightly improved compared to yesterday. Pulled 750 on IS. Has not been OOB.   Objective: Vital signs in last 24 hours: Temp:  [97.5 F (36.4 C)-98.8 F (37.1 C)] 98.1 F (36.7 C) (05/28 0923) Pulse Rate:  [67-94] 86 (05/28 0923) Resp:  [16-27] 16 (05/28 0923) BP: (117-145)/(76-89) 135/85 (05/28 0923) SpO2:  [93 %-100 %] 95 % (05/28 0923) FiO2 (%):  [0 %] 0 % (05/28 0533) Last BM Date : 06/09/22  Intake/Output from previous day: 05/27 0701 - 05/28 0700 In: 2262 [I.V.:1387; IV Piggyback:875] Out: 2330 [Urine:2260; Stool:30; Blood:40] Intake/Output this shift: Total I/O In: 0  Out: 650 [Urine:650]  PE: Gen:  Alert, NAD, pleasant Card:  Regular rate and rhythm Pulm:  Normal effort Abd: Soft, appropriately tender, midline incision c/d/I, stoma viable - no gas or stool in ostomy pouch  GU: clear yellow urine in foley bag Skin: warm and dry, no rashes  Psych: A&Ox3   Lab Results:  Recent Labs    06/09/22 0402 06/10/22 0441  WBC 11.4* 14.9*  HGB 15.9 14.4  HCT 48.4 43.6  PLT 278 289   BMET Recent Labs    06/09/22 0402 06/10/22 0441  NA 136 133*  K 3.1* 4.4  CL 106 101  CO2 19* 24  GLUCOSE 123* 98  BUN 9 9  CREATININE 0.90 0.77  CALCIUM 8.1* 8.0*   PT/INR No results for input(s): "LABPROT", "INR" in the last 72 hours. CMP     Component Value Date/Time   NA 133 (L) 06/10/2022 0441   K 4.4 06/10/2022 0441   CL 101 06/10/2022 0441   CO2 24 06/10/2022 0441   GLUCOSE 98 06/10/2022 0441   BUN 9 06/10/2022 0441   CREATININE 0.77 06/10/2022 0441   CALCIUM 8.0 (L) 06/10/2022 0441   PROT 6.7 06/08/2022 1640   ALBUMIN 3.4 (L) 06/08/2022 1640   AST 20 06/08/2022 1640   ALT 19 06/08/2022 1640   ALKPHOS 55 06/08/2022 1640   BILITOT 0.8 06/08/2022 1640   GFRNONAA >60 06/10/2022 0441   Lipase     Component Value Date/Time   LIPASE 31 06/08/2022 1640        Studies/Results: DG Abd Portable 1V  Result Date: 06/08/2022 CLINICAL DATA:  NG tube placement EXAM: PORTABLE ABDOMEN - 1 VIEW COMPARISON:  CT today FINDINGS: NG tube tip is in the proximal stomach near the GE junction. Side port is in the distal esophagus. Gaseous distention of the visualized colon. Visualized lung bases clear. IMPRESSION: NG tube tip near the GE junction with the side port in the distal esophagus. This could be advanced several cm for optimal positioning. Electronically Signed   By: Charlett Nose M.D.   On: 06/08/2022 19:21   CT ABDOMEN PELVIS W CONTRAST  Result Date: 06/08/2022 CLINICAL DATA:  Abdominal pain, acute, nonlocalized EXAM: CT ABDOMEN AND PELVIS WITH CONTRAST TECHNIQUE: Multidetector CT imaging of the abdomen and pelvis was performed using the standard protocol following bolus administration of intravenous contrast. RADIATION DOSE REDUCTION: This exam was performed according to the departmental dose-optimization program which includes automated exposure control, adjustment of the mA and/or kV according to patient size and/or use of iterative reconstruction technique. 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 Small hiatal hernia. Hepatobiliary: Stable scattered subcentimeter and pericentimeter  hypodense hepatic lesions with largest measuring 2.2 x 1.7 cm with a density of 6 Hounsfield units. These likely represent simple hepatic cysts. No gallstones, gallbladder wall thickening, or pericholecystic fluid. No biliary dilatation. Pancreas: No focal lesion. Normal pancreatic contour. No surrounding inflammatory changes. No main pancreatic ductal dilatation. Spleen: Normal in size without focal abnormality. Adrenals/Urinary Tract: No adrenal nodule bilaterally. Bilateral kidneys enhance symmetrically. Punctate  calcification within the left kidney. No ureterolithiasis. No right nephrolithiasis. No hydronephrosis. No hydroureter. The urinary bladder is unremarkable. On delayed imaging, there is no urothelial wall thickening and there are no filling defects in the opacified portions of the bilateral collecting systems or ureters. Stomach/Bowel: Stomach is within normal limits. Irregular bowel wall thickening of the sigmoid colon extending approximately 9 cm in the craniocaudal dimension with associated gaseous and liquid stool dilatation of the proximal large bowel up to a caliber of 9 cm. No evidence of small bowel wall thickening or dilatation. Appendix appears normal. Vascular/Lymphatic: No abdominal aorta or iliac aneurysm. Severe atherosclerotic plaque of the aorta and its branches. Multiple prominent but non-enlarged pericolonic lymph nodes along the rectum and sigmoid colon (2:65). No abdominal or inguinal lymphadenopathy. Reproductive: Prominent prostate measuring up to 4.6 cm Other: No intraperitoneal free fluid. No intraperitoneal free gas. No organized fluid collection. Musculoskeletal: No abdominal wall hernia or abnormality. No suspicious lytic or blastic osseous lesions. No acute displaced fracture. Multilevel degenerative changes of the spine. IMPRESSION: 1. Obstructive sigmoid mass extending approximately 9 cm in the craniocaudal dimension. Findings suggestive of malignancy. 2. High-grade large bowel obstruction with no findings of perforation. Recommend surgical consultation. 3. Multiple prominent but non-enlarged pericolonic lymph nodes along the rectum and sigmoid colon. Recommend attention on follow-up. 4. Colonic diverticulosis with no acute diverticulitis. 5. Nonobstructive punctate right nephrolithiasis. 6. Prominent prostate. 7. Small hiatal hernia. 8.  Aortic Atherosclerosis (ICD10-I70.0). Electronically Signed   By: Tish Frederickson M.D.   On: 06/08/2022 18:21    Anti-infectives: Anti-infectives  (From admission, onward)    Start     Dose/Rate Route Frequency Ordered Stop   06/09/22 1123  sodium chloride 0.9 % with cefoTEtan (CEFOTAN) ADS Med       Note to Pharmacy: Gerre Pebbles A: cabinet override      06/09/22 1123 06/09/22 1127   06/09/22 1000  cefoTEtan (CEFOTAN) 2 g in sodium chloride 0.9 % 100 mL IVPB        2 g 200 mL/hr over 30 Minutes Intravenous On call to O.R. 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 Magnus Ivan - POD#1, afebrile, VSS - allow CLD and await bowel function - WOC RN consult for new ostomy  - leave foley today, significant adhesiolysis and manipulation in the pelvis. Plan to remove foley tomorrow POD#2 - ok to start Lovenox from a surgical standpoint for DVT ppx     LOS: 2 days   I reviewed nursing notes, hospitalist notes, last 24 h vitals and pain scores, last 48 h intake and output, last 24 h labs and trends, and last 24 h imaging results.    [name], PA-C [facility] Please see Amion for pager number during day hours 7:00am-4:30pm

## 2022-06-10 NOTE — Progress Notes (Signed)
PROGRESS NOTE  Roger Mcdonald ZOX:[id number] DOB: 01-30-1950 DOA: 06/08/2022 PCP: Pcp, No  HPI/Recap of past 24 hours: Roger Mcdonald is a 72 y.o. male with no significant past medical history and surgical history of C-spine surgery and tonsillectomy presented to the ED with abdominal pain and distension for the past 2 months.  CT abdomen/pelvis showed obstructive sigmoid mass extending approximately 9 cm in the craniocaudal dimension, findings suggestive of malignancy, high-grade large bowel obstruction with no findings of perforation. General surgery was consulted and NG tube placed.  Patient admitted for further management.    Today, patient reports postop abdominal pain, some indigestion.  Denied any other new complaints.   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   Weight as of this encounter: 99.2 kg.      Code Status: DNR  Family Communication: None at bedside  Disposition Plan: Status is: Inpatient Remains inpatient appropriate because: Level of care      Consultants: General surgery  Procedures: Surgery as above  Antimicrobials: None  DVT prophylaxis: Lovenox    Objective: Vitals:   06/10/22 0749 06/10/22 0923 06/10/22 1146 06/10/22 1344  BP:   135/85  (!) 127/91  Pulse:  86  (!) 101  Resp: 17 16 19 16   Temp:  98.1 F (36.7 C)  98.1 F (36.7 C)  TempSrc:  Oral  Oral  SpO2: 95% 95% 94% 94%  Weight:      Height:        Intake/Output Summary (Last 24 hours) at 06/10/2022 1546 Last data filed at 06/10/2022 1400 Gross per 24 hour  Intake 753.45 ml  Output 3375 ml  Net -2621.55 ml   Filed Weights   06/08/22 1511  Weight: 99.2 kg    Exam:  General: NAD  Cardiovascular: S1, S2 present Respiratory: CTAB Abdomen: Soft, tender, distended, no bowel sounds present, dressing with some dried blood, but intact Musculoskeletal: No bilateral pedal edema noted Skin: Normal Psychiatry: Normal mood   Data Reviewed: CBC: Recent Labs  Lab 06/08/22 1556 06/09/22 0402 06/10/22 0441  WBC 11.5* 11.4* 14.9*  NEUTROABS 8.3*  --   --   HGB 15.0 15.9 14.4  HCT 44.9 48.4 43.6  MCV 88.0 89.8 89.2  PLT 299 278 289   Basic Metabolic Panel: Recent Labs  Lab 06/08/22 1640 06/09/22 0402 06/10/22 0441  NA 136 136 133*  K 3.6 3.1* 4.4  CL 105 106 101  CO2 23 19* 24  GLUCOSE 114* 123* 98  BUN 13 9 9   CREATININE 1.00 0.90 0.77  CALCIUM 8.0* 8.1* 8.0*  MG  --  2.3  --    GFR: Estimated Creatinine Clearance: 93.6 mL/min (by C-G formula based on SCr of 0.77 mg/dL). Liver Function Tests: Recent Labs  Lab 06/08/22 1640  AST 20  ALT 19  ALKPHOS 55  BILITOT 0.8  PROT 6.7  ALBUMIN 3.4*   Recent Labs  Lab 06/08/22 1640  LIPASE 31   No results for input(s): "AMMONIA" in the last 168 hours. Coagulation Profile: No results for input(s): "INR", "PROTIME" in the last 168 hours. Cardiac Enzymes: No results for input(s): "CKTOTAL", "CKMB", "CKMBINDEX", "TROPONINI" in the last 168 hours. BNP (last 3 results) No results for input(s): "PROBNP" in the last 8760 hours. HbA1C: No results for input(s): "HGBA1C" in the last 72 hours. CBG: No results for input(s): "GLUCAP" in the last 168 hours. Lipid Profile: No results for  input(s): "CHOL", "HDL", "LDLCALC", "TRIG", "CHOLHDL", "LDLDIRECT" in the last 72 hours. Thyroid Function Tests: No results for input(s): "TSH", "T4TOTAL", "FREET4", "T3FREE", "THYROIDAB" in the last 72 hours. Anemia Panel: No results for input(s): "VITAMINB12", "FOLATE", "FERRITIN", "TIBC", "IRON", "RETICCTPCT" in the last 72 hours. Urine analysis:    Component Value Date/Time   COLORURINE YELLOW 06/08/2022 1557   APPEARANCEUR CLEAR 06/08/2022 1557   LABSPEC >=1.030 06/08/2022 1557   PHURINE 5.5 06/08/2022 1557   GLUCOSEU NEGATIVE 06/08/2022 1557   HGBUR NEGATIVE 06/08/2022 1557   BILIRUBINUR SMALL (A) 06/08/2022 1557   KETONESUR NEGATIVE 06/08/2022 1557   PROTEINUR 30 (A) 06/08/2022 1557   NITRITE NEGATIVE 06/08/2022 1557   LEUKOCYTESUR NEGATIVE 06/08/2022 1557   Sepsis Labs: @LABRCNTIP (procalcitonin:4,lacticidven:4)  ) Recent Results (from the past 240 hour(s))  Surgical PCR screen     Status: None   Collection Time: 06/09/22  8:33 AM   Specimen: Nasal Mucosa; Nasal Swab  Result Value Ref Range Status   MRSA, PCR NEGATIVE NEGATIVE Final   Staphylococcus aureus NEGATIVE NEGATIVE Final    Comment: (NOTE) The Xpert SA Assay (FDA approved for NASAL specimens in patients 78 years of age and older), is one component of a comprehensive surveillance program. It is not intended to diagnose infection nor to guide or monitor treatment. Performed at [facility], [address], [place], [place] [address]       Studies: No results found.  Scheduled Meds:  acetaminophen  650 mg Oral Q6H   Or   acetaminophen  650 mg Rectal Q6H   lidocaine  1 patch Transdermal Q24H   morphine   Intravenous Q4H   pantoprazole  40 mg Oral Daily    Continuous Infusions:  sodium chloride 75 mL/hr at 06/10/22 0531     LOS: 2 days     Briant Cedar, MD [facility]  If 7PM-7AM, please contact night-coverage [email or web address] 06/10/2022, 3:46 PM

## 2022-06-10 NOTE — Consult Note (Signed)
WOC consulted for new colostomy Will follow up 5/29, attempting to identify support person for teaching if needed.   Ellarae Nevitt [facility], CNS, [employer] [phone]

## 2022-06-10 NOTE — Evaluation (Signed)
Occupational Therapy Evaluation Patient Details Name: Roger Mcdonald MRN: [id number] DOB: 1950/05/25 Today's Date: 06/10/2022   History of Present Illness Patient is a 72 year old male who presented on 5/26 with abdominal pain. CT revealed obstructive sigmoid mass suggestive of malignance and high grade large bowel obstruction. On 5/25, Patient underwent exploratory laparotomy with partial colectomy and colostomy. PMH: c spine surgery, tonsillectomy,   Clinical Impression   Patient is a 72 year old male who was admitted for above. Patient was living at home independently for ADLs prior level. Currently, patient is max A for LB dressing with min guard for transfers with noted increased pain with all movements in abdomen. Patient was educated on using pillow for coughing. Patient verbalized understanding.  Anticipate that patient will be able to progress during acute stay to not need follow up in next level of care. Patient would continue to benefit from skilled OT services at this time while admitted and after d/c to address noted deficits in order to improve overall safety and independence in ADLs.       Recommendations for follow up therapy are one component of a multi-disciplinary discharge planning process, led by the attending physician.  Recommendations may be updated based on patient status, additional functional criteria and insurance authorization.   Assistance Recommended at Discharge Intermittent Supervision/Assistance  Patient can return home with the following Assistance with cooking/housework;Direct supervision/assist for medications management;Assist for transportation;Help with stairs or ramp for entrance;Direct supervision/assist for financial management;A little help with bathing/dressing/bathroom    Functional Status Assessment  Patient has had a recent decline in their functional status and demonstrates the ability to make significant improvements in function in a reasonable and  predictable amount of time.  Equipment Recommendations  Other (comment) (total hip kit)       Precautions / Restrictions Precautions Precaution Comments: colostomy, abdominal  wound Restrictions Weight Bearing Restrictions: No      Mobility Bed Mobility Overal bed mobility: Needs Assistance Bed Mobility: Sit to Supine       Sit to supine: Min guard   General bed mobility comments: paitent was able to complete sit to supine with increased time and cues to slow down            Balance Overall balance assessment: Mild deficits observed, not formally tested             ADL either performed or assessed with clinical judgement   ADL Overall ADL's : Needs assistance/impaired Eating/Feeding: Modified independent;Sitting   Grooming: Modified independent;Sitting     Upper Body Bathing Details (indicate cue type and reason): patient strongly requested to stand at sink to wash hair. patient was educated on alternatives that would not put pressure on abdomen. patient declined these alternatives. patient stood at sink and compelted washing hair with increased time. patient declined to change hosptial gowns afterwards with noted water on it. patient reported " im good" nurse made aware. Lower Body Bathing: Maximal assistance;Sit to/from stand;Sitting/lateral leans   Upper Body Dressing : Set up;Sitting   Lower Body Dressing: Sitting/lateral leans;Sit to/from stand;Moderate assistance   Toilet Transfer: Hydrographic surveyor Details (indicate cue type and reason): with no AD to transfer to sink in room and then into bed with increased time. Toileting- Architect and Hygiene: Sit to/from stand;Total assistance                Pertinent Vitals/Pain Pain Assessment Pain Assessment: 0-10 Pain Score: 6  Pain Location: abdomen with transitions Pain  Descriptors / Indicators: Grimacing, Discomfort, Guarding Pain Intervention(s): Limited activity within  patient's tolerance, Monitored during session, Premedicated before session, PCA encouraged     Hand Dominance Right   Extremity/Trunk Assessment Upper Extremity Assessment Upper Extremity Assessment: Overall WFL for tasks assessed   Lower Extremity Assessment Lower Extremity Assessment: Defer to PT evaluation   Cervical / Trunk Assessment Cervical / Trunk Assessment: Other exceptions Cervical / Trunk Exceptions: abdominal surgery      Cognition Arousal/Alertness: Awake/alert Behavior During Therapy: WFL for tasks assessed/performed Overall Cognitive Status: Within Functional Limits for tasks assessed       General Comments: slightly impsulive at times.                Home Living Family/patient expects to be discharged to:: Private residence Living Arrangements: Spouse/significant other;Other relatives Available Help at Discharge: Family;Available 24 hours/day Type of Home: House       Home Layout: One level     Bathroom Shower/Tub: Runner, broadcasting/film/video: None          Prior Functioning/Environment Prior Level of Function : Independent/Modified Independent             Mobility Comments: just  finished a long motorcycle ride          OT Problem List: Pain;Decreased knowledge of use of DME or AE;Decreased knowledge of precautions      OT Treatment/Interventions: Self-care/ADL training;DME and/or AE instruction;Therapeutic exercise;Therapeutic activities;Patient/family education;Balance training    OT Goals(Current goals can be found in the care plan section) Acute Rehab OT Goals Patient Stated Goal: to get better OT Goal Formulation: With patient Time For Goal Achievement: 06/24/22 Potential to Achieve Goals: Fair  OT Frequency: Min 1X/week       AM-PAC OT "6 Clicks" Daily Activity     Outcome Measure Help from another person eating meals?: None Help from another person taking care of personal grooming?: None Help from  another person toileting, which includes using toliet, bedpan, or urinal?: A Little Help from another person bathing (including washing, rinsing, drying)?: A Lot Help from another person to put on and taking off regular upper body clothing?: A Little Help from another person to put on and taking off regular lower body clothing?: A Lot 6 Click Score: 18   End of Session Nurse Communication: Other (comment) (patients participation in session)  Activity Tolerance: Patient tolerated treatment well Patient left: in bed;with call bell/phone within reach;with bed alarm set  OT Visit Diagnosis: Unsteadiness on feet (R26.81);Other abnormalities of gait and mobility (R26.89)                Time: 1455-1520 OT Time Calculation (min): 25 min Charges:  OT General Charges $OT Visit: 1 Visit OT Evaluation $OT Eval Low Complexity: 1 Low OT Treatments $Self Care/Home Management : 8-22 mins  Rosalio Loud, MS Acute Rehabilitation Department Office# [phone]   Selinda Flavin 06/10/2022, 4:02 PM

## 2022-06-10 NOTE — TOC Initial Note (Signed)
Transition of Care [facility]) - Initial/Assessment Note   Patient Details  Name: Roger Mcdonald MRN: [id number] Date of Birth: 1950-02-24  Transition of [facility]) CM/SW Contact:    Ewing Schlein, LCSW Phone Number: 06/10/2022, 3:45 PM  Clinical Narrative: Patient has a new ostomy and will need HHRN after discharge. CSW met with patient to discuss referral to [facility] agencies. CSW made [facility] referral to [facility] with [facility], which is under review.  Expected Discharge Plan: Home w Home Health Services Barriers to Discharge: Continued Medical Work up  Patient Goals and CMS Choice Patient states their goals for this hospitalization and ongoing recovery are:: Return home CMS Medicare.gov Compare Post Acute Care list provided to:: Patient Choice offered to / list presented to : Patient  Expected Discharge Plan and Services In-house Referral: Clinical Social Work Post Acute Care Choice: Home Health Living arrangements for the past 2 months: Single Family Home           DME Arranged: N/A DME Agency: NA  Prior Living Arrangements/Services Living arrangements for the past 2 months: Single Family Home Patient language and need for interpreter reviewed:: Yes Do you feel safe going back to the place where you live?: Yes      Need for Family Participation in Patient Care: No (Comment) Care giver support system in place?: Yes (comment) Criminal Activity/Legal Involvement Pertinent to Current Situation/Hospitalization: No - Comment as needed  Activities of Daily Living Home Assistive Devices/Equipment: None ADL Screening (condition at time of admission) Patient's cognitive ability adequate to safely complete daily activities?: Yes Is the patient deaf or have difficulty hearing?: No Does the patient have difficulty seeing, even when wearing glasses/contacts?: No Does the patient have difficulty concentrating, remembering, or making decisions?: No Patient able to express need for assistance with ADLs?:  Yes Does the patient have difficulty dressing or bathing?: No Independently performs ADLs?: Yes (appropriate for developmental age) Does the patient have difficulty walking or climbing stairs?: No Weakness of Legs: None Weakness of Arms/Hands: None  Permission Sought/Granted Permission sought to share information with : Other (comment) Permission granted to share information with : Yes, Verbal Permission Granted Permission granted to share info w AGENCY: HH agencies  Emotional Assessment Appearance:: Appears stated age Attitude/Demeanor/Rapport: Engaged Affect (typically observed): Accepting Orientation: : Oriented to Self, Oriented to Place, Oriented to  Time, Oriented to Situation Alcohol / Substance Use: Not Applicable Psych Involvement: No (comment)  Admission diagnosis:  Large bowel obstruction (HCC) [K56.609] Colonic mass [K63.89] Complete intestinal obstruction, unspecified cause (HCC) [K56.601] Patient Active Problem List   Diagnosis Date Noted   Large bowel obstruction (HCC) 06/08/2022   Colonic mass 06/08/2022   PCP:  Pcp, No Pharmacy:  No Pharmacies Listed  Social Determinants of Health (SDOH) Social History: SDOH Screenings   Food Insecurity: No Food Insecurity (06/09/2022)  Housing: Low Risk  (06/09/2022)  Transportation Needs: No Transportation Needs (06/09/2022)  Utilities: Not At Risk (06/09/2022)  Tobacco Use: High Risk (06/10/2022)   SDOH Interventions: Food Insecurity Interventions: Intervention Not Indicated Housing Interventions: Intervention Not Indicated Transportation Interventions: Intervention Not Indicated Utilities Interventions: Intervention Not Indicated  Readmission Risk Interventions     No data to display

## 2022-06-11 DIAGNOSIS — D72829 Elevated white blood cell count, unspecified: Secondary | ICD-10-CM

## 2022-06-11 DIAGNOSIS — E876 Hypokalemia: Secondary | ICD-10-CM | POA: Diagnosis not present

## 2022-06-11 DIAGNOSIS — K6389 Other specified diseases of intestine: Secondary | ICD-10-CM | POA: Diagnosis not present

## 2022-06-11 DIAGNOSIS — K56609 Unspecified intestinal obstruction, unspecified as to partial versus complete obstruction: Secondary | ICD-10-CM | POA: Diagnosis not present

## 2022-06-11 LAB — BASIC METABOLIC PANEL
Anion gap: 7 (ref 5–15)
BUN: 11 mg/dL (ref 8–23)
CO2: 28 mmol/L (ref 22–32)
Calcium: 8.6 mg/dL — ABNORMAL LOW (ref 8.9–10.3)
Chloride: 96 mmol/L — ABNORMAL LOW (ref 98–111)
Creatinine, Ser: 0.94 mg/dL (ref 0.61–1.24)
GFR, Estimated: >60 mL/min (ref >60)
Glucose, Bld: 105 mg/dL — ABNORMAL HIGH (ref 70–99)
Potassium: 4.7 mmol/L (ref 3.5–5.1)
Sodium: 131 mmol/L — ABNORMAL LOW (ref 135–145)

## 2022-06-11 LAB — CBC
HCT: 45.8 % (ref 39.0–52.0)
Hemoglobin: 15 g/dL (ref 13.0–17.0)
MCH: 29.4 pg (ref 26.0–34.0)
MCHC: 32.8 g/dL (ref 30.0–36.0)
MCV: 89.6 fL (ref 80.0–100.0)
Platelets: 293 10*3/uL (ref 150–400)
RBC: 5.11 MIL/uL (ref 4.22–5.81)
RDW: 13.1 % (ref 11.5–15.5)
WBC: 13.1 10*3/uL — ABNORMAL HIGH (ref 4.0–10.5)
nRBC: 0 % (ref 0.0–0.2)

## 2022-06-11 LAB — SURGICAL PATHOLOGY

## 2022-06-11 MED ORDER — ACETAMINOPHEN 500 MG PO TABS
1000.0000 mg | ORAL_TABLET | Freq: Four times a day (QID) | ORAL | Status: DC
  Administered 2022-06-11 – 2022-06-13 (×3): 1000 mg via ORAL
  Filled 2022-06-11 (×5): qty 2

## 2022-06-11 MED ORDER — OXYCODONE HCL 5 MG PO TABS
5.0000 mg | ORAL_TABLET | ORAL | Status: DC | PRN
  Filled 2022-06-11: qty 1

## 2022-06-11 MED ORDER — MORPHINE SULFATE (PF) 2 MG/ML IV SOLN: 2.0000 mg | INTRAVENOUS | Status: DC | PRN

## 2022-06-11 MED ORDER — ACETAMINOPHEN 650 MG RE SUPP: 650.0000 mg | Freq: Four times a day (QID) | RECTAL | Status: DC

## 2022-06-11 NOTE — Consult Note (Signed)
WOC Nurse ostomy consult note Stoma type/location: LLQ, end colostomy  Stomal assessment/size: 2" round, budded, ruddy, moist Peristomal assessment: NA Treatment options for stomal/peristomal skin: NA Output bloody, no flatus Ostomy pouching: 1pc.flat in place from OR, due to size of stoma feel like will need to stay in one pc flat for now with barrier ring Education provided:  Explained role of ostomy nurse and creation of stoma  Education on emptying when 1/3 to 1/2 full and how to empty Discussed bathing, diet, gas, medication use, constipation Discussed risk of peristomal hernia Provided patient with [facility] Answered patient/family questions:  questions about change frequency, emptying frequency.  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  Patient feels he can not see stoma, will need assistance from wife and daughter, reports they both are not working. Contacted wife while WOC in the room, she is not a morning person and will struggle to be in for teaching prior to noon tomorrow. Daughter has some "medical training" and is willing to learn but will need transportation with wife.  Set up teaching visit for 12 noon 5/30, reiterated several times need for family support and teaching session timing for tomorrow. Will plan pouch change with patient even if family is not present tomorrow. Supplies in the room       Enrolled patient in [place] Secure Start DC program: Yes  WOC Nurse will follow along with you for continued support with ostomy teaching and care Auriel Kist [facility] MSN, RN, [place], CNS, [place] [phone]

## 2022-06-11 NOTE — TOC Progression Note (Signed)
Transition of Care [facility]) - Progression Note   Patient Details  Name: Roger Mcdonald MRN: [id number] Date of Birth: 1950/06/02  Transition of Care [facility]) CM/SW Contact  Ewing Schlein, LCSW Phone Number: 06/11/2022, 1:29 PM  Clinical Narrative: [facility] accepted the referral for [facility]. CSW updated patient. HH orders requested.  Expected Discharge Plan: Home w Home Health Services Barriers to Discharge: Continued Medical Work up  Expected Discharge Plan and Services In-house Referral: Clinical Social Work Post Acute Care Choice: Home Health Living arrangements for the past 2 months: Single Family Home            DME Arranged: N/A DME Agency: NA HH Arranged: RN HH Agency: [facility] Date HH Agency Contacted: 06/10/22 Representative spoke with at [facility] Agency: Tresa Endo  Social Determinants of Health (SDOH) Interventions SDOH Screenings   Food Insecurity: No Food Insecurity (06/09/2022)  Housing: Low Risk  (06/09/2022)  Transportation Needs: No Transportation Needs (06/09/2022)  Utilities: Not At Risk (06/09/2022)  Tobacco Use: High Risk (06/10/2022)   Readmission Risk Interventions     No data to display

## 2022-06-11 NOTE — Progress Notes (Signed)
[facility] Progress Note  2 Days Post-Op  Subjective: CC:  Abdominal pain overall improved but still having soreness with deep inspiration/mobilitiy. Pulling 1,000 cc on IS. Thinks he heard his ostomy pass gas. Denies nausea/vomiting. Asking to eat solid food   Objective: Vital signs in last 24 hours: Temp:  [98.1 F (36.7 C)-98.3 F (36.8 C)] 98.3 F (36.8 C) (05/29 0512) Pulse Rate:  [85-101] 87 (05/29 0512) Resp:  [16-19] 18 (05/29 0512) BP: (119-135)/(71-91) 130/71 (05/29 0512) SpO2:  [94 %-96 %] 96 % (05/29 0512) Last BM Date : 06/09/22  Intake/Output from previous day: 05/28 0701 - 05/29 0700 In: 2743.6 [P.O.:900; I.V.:1843.6] Out: 4465 [Urine:4450; Stool:15] Intake/Output this shift: No intake/output data recorded.  PE: Gen:  Alert, NAD, cooperative  Card:  Regular rate and rhythm Pulm:  Normal effort Abd: Soft, appropriately tender, midline incision c/d/I, stoma viable - no gas or stool in ostomy pouch  GU: clear yellow urine in foley bag Skin: warm and dry, no rashes  Psych: A&Ox3   Lab Results:  Recent Labs    06/10/22 0441 06/11/22 0455  WBC 14.9* 13.1*  HGB 14.4 15.0  HCT 43.6 45.8  PLT 289 293   BMET Recent Labs    06/10/22 0441 06/11/22 0455  NA 133* 131*  K 4.4 4.7  CL 101 96*  CO2 24 28  GLUCOSE 98 105*  BUN 9 11  CREATININE 0.77 0.94  CALCIUM 8.0* 8.6*   PT/INR No results for input(s): "LABPROT", "INR" in the last 72 hours. CMP     Component Value Date/Time   NA 131 (L) 06/11/2022 0455   K 4.7 06/11/2022 0455   CL 96 (L) 06/11/2022 0455   CO2 28 06/11/2022 0455   GLUCOSE 105 (H) 06/11/2022 0455   BUN 11 06/11/2022 0455   CREATININE 0.94 06/11/2022 0455   CALCIUM 8.6 (L) 06/11/2022 0455   PROT 6.7 06/08/2022 1640   ALBUMIN 3.4 (L) 06/08/2022 1640   AST 20 06/08/2022 1640   ALT 19 06/08/2022 1640   ALKPHOS 55 06/08/2022 1640   BILITOT 0.8 06/08/2022 1640   GFRNONAA >60 06/11/2022 0455   Lipase      Component Value Date/Time   LIPASE 31 06/08/2022 1640       Studies/Results: No results found.  Anti-infectives: Anti-infectives (From admission, onward)    Start     Dose/Rate Route Frequency Ordered Stop   06/09/22 1123  sodium chloride 0.9 % with cefoTEtan (CEFOTAN) ADS Med       Note to Pharmacy: Gerre Pebbles A: cabinet override      06/09/22 1123 06/09/22 1127   06/09/22 1000  cefoTEtan (CEFOTAN) 2 g in sodium chloride 0.9 % 100 mL IVPB        2 g 200 mL/hr over 30 Minutes Intravenous On call to O.R. 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 Magnus Ivan - POD#2, afebrile, VSS, path pending - advance to FLD and await bowel function - WOC RN consult for new ostomy -- patient tells me he lives with his wife who is not in good health, and two of his kids who may be able to assist him with ostomy care. - D/C foley - D/C PCA, schedule tylenol, lidoderm patch, PRN oxy, PRN morphine for breakthrough - Lovenox for DVT ppx     LOS: 3 days   I reviewed nursing notes, hospitalist notes, last 97  h vitals and pain scores, last 48 h intake and output, last 24 h labs and trends, and last 24 h imaging results.    Roger Spangle, PA-C [facility] Please see Amion for pager number during day hours 7:00am-4:30pm

## 2022-06-11 NOTE — Progress Notes (Signed)
71 year old male presented with abdominal pain. Pt stated for the past two months he has had worsening gas and abdominal distension. On 06/09/22 pt had an exploratory lap and a obstruction mass was found in the sigmoid colon. The proximal colon was massively dilated. A sigmoid colectomy and colostomy was performed.   Pt is alert and oriented x4. Pt has clear speech with regular unlabored breathing. Chest expansion is symmetrical with clear, diminished breath sounds. S1 and S2 sounds are present. Abdomen is soft and has a surgical scar with honeycomb dressing.   Lanesha Azzaro, Student RN 

## 2022-06-11 NOTE — Progress Notes (Signed)
Occupational Therapy Treatment Patient Details Name: Roger Mcdonald MRN: [id number] DOB: 06/25/1950 Today's Date: 06/11/2022   History of present illness Patient is a 72 year old male who presented on 5/26 with abdominal pain. CT revealed obstructive sigmoid mass suggestive of malignance and high grade large bowel obstruction. On 5/25, Patient underwent exploratory laparotomy with partial colectomy and colostomy. PMH: c spine surgery, tonsillectomy,   OT comments  Patient was able to participate in LB dressing with AE with increased time seated in recliner. Patient reporting less pain today and able to engage in more functional activity than previous day. Patient's discharge plan remains appropriate at this time. OT will continue to follow acutely.     Recommendations for follow up therapy are one component of a multi-disciplinary discharge planning process, led by the attending physician.  Recommendations may be updated based on patient status, additional functional criteria and insurance authorization.    Assistance Recommended at Discharge PRN  Patient can return home with the following  Assistance with cooking/housework;Assist for transportation   Equipment Recommendations  Other (comment) (reacher, sock aid)       Precautions / Restrictions Precautions Precaution Comments: colostomy, abdominal  wound Restrictions Weight Bearing Restrictions: No       Mobility Bed Mobility               General bed mobility comments: up in recliner and returned to the same          ADL either performed or assessed with clinical judgement   ADL Overall ADL's : Needs assistance/impaired                       Lower Body Dressing Details (indicate cue type and reason): educated on using AE for LB dressing. patient was able to demonstrate understanding with AE with simulated pants and don socks with sock aid. patient reported he had a reacher at home and would look into sock  aid.               General ADL Comments: patient was able to participate in functional moblity in the hallway with no AD with no LOB to window and back to room. patient was educated on importance of walking and movement for healing process. patient verbalized understanding.      Cognition Arousal/Alertness: Awake/alert Behavior During Therapy: WFL for tasks assessed/performed Overall Cognitive Status: Within Functional Limits for tasks assessed                         Pertinent Vitals/ Pain       Pain Assessment Pain Assessment: No/denies pain         Frequency  Min 1X/week        Progress Toward Goals  OT Goals(current goals can now be found in the care plan section)  Progress towards OT goals: Progressing toward goals     Plan Discharge plan remains appropriate       AM-PAC OT "6 Clicks" Daily Activity     Outcome Measure   Help from another person eating meals?: None Help from another person taking care of personal grooming?: None Help from another person toileting, which includes using toliet, bedpan, or urinal?: None Help from another person bathing (including washing, rinsing, drying)?: A Little Help from another person to put on and taking off regular upper body clothing?: A Little Help from another person to put on and taking off regular lower body clothing?: A  Little 6 Click Score: 21    End of Session    OT Visit Diagnosis: Unsteadiness on feet (R26.81);Other abnormalities of gait and mobility (R26.89)   Activity Tolerance Patient tolerated treatment well   Patient Left in chair;with call bell/phone within reach   Nurse Communication Other (comment) (ok to participate in session)        Time: 1610-9604 OT Time Calculation (min): 21 min  Charges: OT General Charges $OT Visit: 1 Visit OT Treatments $Self Care/Home Management : 8-22 mins  Rosalio Loud, MS Acute Rehabilitation Department Office# [phone]   Selinda Flavin 06/11/2022, 12:00 PM

## 2022-06-11 NOTE — H&P (Deleted)
72 year old male presented with abdominal pain. Pt stated for the past two months he has had worsening gas and abdominal distension. On 06/09/22 pt had an exploratory lap and a obstruction mass was found in the sigmoid colon. The proximal colon was massively dilated. A sigmoid colectomy and colostomy was performed.   Pt is alert and oriented x4. Pt has clear speech with regular unlabored breathing. Chest expansion is symmetrical with clear, diminished breath sounds. S1 and S2 sounds are present. Abdomen is soft and has a surgical scar with honeycomb dressing.   Barnett Abu, Student RN

## 2022-06-12 DIAGNOSIS — D72829 Elevated white blood cell count, unspecified: Secondary | ICD-10-CM | POA: Diagnosis present

## 2022-06-12 DIAGNOSIS — K56609 Unspecified intestinal obstruction, unspecified as to partial versus complete obstruction: Secondary | ICD-10-CM | POA: Diagnosis not present

## 2022-06-12 DIAGNOSIS — E876 Hypokalemia: Secondary | ICD-10-CM | POA: Diagnosis not present

## 2022-06-12 DIAGNOSIS — K5720 Diverticulitis of large intestine with perforation and abscess without bleeding: Secondary | ICD-10-CM

## 2022-06-12 NOTE — Progress Notes (Signed)
PROGRESS NOTE   Roger Mcdonald  ZOX:[id number] DOB: Aug 25, 1950 DOA: 06/08/2022 PCP: [name]    Brief Narrative:  72 y.o. male with no significant past medical history and surgical history presented to the ED with abdominal pain and distension for the past 2 months.    Upon evaluation in the emergency department CT abdomen/pelvis showed obstructive sigmoid mass extending approximately 9 cm in the craniocaudal dimension, findings suggestive of malignancy, high-grade large bowel obstruction with no findings of perforation.  The hospitalist group was then called to assess the patient for admission the hospital.    Patient was made n.p.o. and NG tube was placed.  General surgery was consulted general surgery was consulted and patient underwent exploratory laparotomy with Hartman's procedure on 5/27 by Dr. Magnus Ivan.    Assessment and Plan: * Large bowel obstruction [facility]) Patient is status post exploratory laparotomy with Hartman's procedure on 5/27 Surgery continues to follow in consultation, their assistance is appreciated. 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    Subjective:  Patient states that his abdominal pain is slowly improving.  Patient complains of mild generalized abdominal pain, worse with movement and improved with rest.  Patient is tolerating oral intake.   Physical Exam:  Vitals:   06/11/22 1335 06/12/22 0218 06/12/22 0549 06/12/22 1327  BP: 135/81 (!) 141/85 134/86  (!) 121/92  Pulse: 78 87 99 90  Resp: 16 18 18 18   Temp: 98.2 F (36.8 C) 98.3 F (36.8 C) 98.4 F (36.9 C) 98.2 F (36.8 C)  TempSrc: Oral Oral Oral Oral  SpO2: 100% 97% 95% 96%  Weight:      Height:        Constitutional: Awake alert and oriented x3, no associated distress.   Skin: no rashes, no lesions, good skin turgor noted. Eyes: Pupils are equally reactive to light.  No evidence of scleral icterus or conjunctival pallor.  ENMT: Moist mucous membranes noted.  Posterior pharynx clear of any exudate or lesions.   Respiratory: clear to auscultation bilaterally, no wheezing, no crackles. Normal respiratory effort. No accessory muscle use.  Cardiovascular: Regular rate and rhythm, no murmurs / rubs / gallops. No extremity edema. 2+ pedal pulses. No carotid bruits.  Abdomen: Mild generalized abdominal tenderness, albeit somewhat improved compared to yesterday.  Abdomen is soft.  Ostomy continuing to drain small amount of dark brown liquid consistency stool.   Hypoactive bowel sounds.  Musculoskeletal: No joint deformity upper and lower extremities. Good ROM, no contractures. Normal muscle tone.    Data Reviewed:  I have personally reviewed and interpreted labs, imaging.  Significant findings are   CBC: Recent Labs  Lab 06/08/22 1556 06/09/22 0402 06/10/22 0441 06/11/22 0455  WBC 11.5* 11.4* 14.9* 13.1*  NEUTROABS 8.3*  --   --   --   HGB 15.0 15.9 14.4 15.0  HCT 44.9 48.4 43.6 45.8  MCV 88.0 89.8 89.2 89.6  PLT 299 278 289 293   Basic Metabolic Panel: Recent Labs  Lab 06/08/22 1640 06/09/22 0402 06/10/22 0441 06/11/22 0455  NA 136 136 133* 131*  K 3.6 3.1*  4.4 4.7  CL 105 106 101 96*  CO2 23 19* 24 28  GLUCOSE 114* 123* 98 105*  BUN 13 9 9 11   CREATININE 1.00 0.90 0.77 0.94  CALCIUM 8.0* 8.1* 8.0* 8.6*  MG  --  2.3  --   --    GFR: Estimated Creatinine Clearance: 79.7 mL/min (by C-G formula based on SCr of 0.94 mg/dL). Liver Function Tests: Recent Labs   Lab 06/08/22 1640  AST 20  ALT 19  ALKPHOS 55  BILITOT 0.8  PROT 6.7  ALBUMIN 3.4*     Code Status:  DNR.    Severity of Illness:  The appropriate patient status for this patient is INPATIENT. Inpatient status is judged to be reasonable and necessary in order to provide the required intensity of service to ensure the patient's safety. The patient's presenting symptoms, physical exam findings, and initial radiographic and laboratory data in the context of their chronic comorbidities is felt to place them at high risk for further clinical deterioration. Furthermore, it is not anticipated that the patient will be medically stable for discharge from the hospital within 2 midnights of admission.   * I certify that at the point of admission it is my clinical judgment that the patient will require inpatient hospital care spanning beyond 2 midnights from the point of admission due to high intensity of service, high risk for further deterioration and high frequency of surveillance required.*  Time spent:  37 minutes  Author:  Marinda Elk MD

## 2022-06-12 NOTE — Progress Notes (Signed)
[facility] Progress Note  3 Days Post-Op  Subjective: CC:  Cc hunger- wants to eat. Denies nausea or vomiting. Reports his ostomy pouch was full of air this AM.    Objective: Vital signs in last 24 hours: Temp:  [98.2 F (36.8 C)-98.4 F (36.9 C)] 98.4 F (36.9 C) (05/30 0549) Pulse Rate:  [78-99] 99 (05/30 0549) Resp:  [16-18] 18 (05/30 0549) BP: (134-141)/(81-86) 134/86 (05/30 0549) SpO2:  [95 %-100 %] 95 % (05/30 0549) Last BM Date : 06/10/22  Intake/Output from previous day: 05/29 0701 - 05/30 0700 In: 1200 [P.O.:1200] Out: 1852 [Urine:1802; Stool:50] Intake/Output this shift: No intake/output data recorded.  PE: Gen:  Alert, NAD, cooperative  Card:  Regular rate and rhythm Pulm:  Normal effort Abd: Soft, appropriately tender, midline incision c/d/I, stoma viable - scant liquid stool in pouch GU: clear yellow urine in foley bag Skin: warm and dry, no rashes  Psych: A&Ox3   Lab Results:  Recent Labs    06/10/22 0441 06/11/22 0455  WBC 14.9* 13.1*  HGB 14.4 15.0  HCT 43.6 45.8  PLT 289 293   BMET Recent Labs    06/10/22 0441 06/11/22 0455  NA 133* 131*  K 4.4 4.7  CL 101 96*  CO2 24 28  GLUCOSE 98 105*  BUN 9 11  CREATININE 0.77 0.94  CALCIUM 8.0* 8.6*   PT/INR No results for input(s): "LABPROT", "INR" in the last 72 hours. CMP     Component Value Date/Time   NA 131 (L) 06/11/2022 0455   K 4.7 06/11/2022 0455   CL 96 (L) 06/11/2022 0455   CO2 28 06/11/2022 0455   GLUCOSE 105 (H) 06/11/2022 0455   BUN 11 06/11/2022 0455   CREATININE 0.94 06/11/2022 0455   CALCIUM 8.6 (L) 06/11/2022 0455   PROT 6.7 06/08/2022 1640   ALBUMIN 3.4 (L) 06/08/2022 1640   AST 20 06/08/2022 1640   ALT 19 06/08/2022 1640   ALKPHOS 55 06/08/2022 1640   BILITOT 0.8 06/08/2022 1640   GFRNONAA >60 06/11/2022 0455   Lipase     Component Value Date/Time   LIPASE 31 06/08/2022 1640       Studies/Results: No results  found.  Anti-infectives: Anti-infectives (From admission, onward)    Start     Dose/Rate Route Frequency Ordered Stop   06/09/22 1123  sodium chloride 0.9 % with cefoTEtan (CEFOTAN) ADS Med       Note to Pharmacy: Gerre Pebbles A: cabinet override      06/09/22 1123 06/09/22 1127   06/09/22 1000  cefoTEtan (CEFOTAN) 2 g in sodium chloride 0.9 % 100 mL IVPB        2 g 200 mL/hr over 30 Minutes Intravenous On call to O.R. 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 Magnus Ivan - POD#3, afebrile, VSS  - path: A. COLON, SIGMOID, RESECTION:  Diverticulitis with perforation, pericolonic abscess and stricture.  Eleven benign lymph nodes (0/11).  Negative for malignancy.   - advance to SOFT and await further bowel function - WOC RN consult for new ostomy -- patient tells me he lives with his wife is coming up here today for teaching. - schedule tylenol, lidoderm patch, PRN oxy, PRN morphine for breakthrough - Lovenox for DVT ppx     LOS: 4 days   I reviewed nursing notes, hospitalist notes, last 24 h vitals and pain scores, last  48 h intake and output, last 24 h labs and trends, and last 24 h imaging results.    [name], PA-C [facility] Please see Amion for pager number during day hours 7:00am-4:30pm

## 2022-06-12 NOTE — Assessment & Plan Note (Addendum)
Status post resection 5/27 No evidence of malignancy on pathology or lymph nodes Patient apparently had suffered from diverticulitis with perforation and evidence of pericolonic abscess with subsequent stricture No need for oncology referral

## 2022-06-12 NOTE — Hospital Course (Addendum)
72 y.o. male with no significant past medical history and surgical history presented to the ED with abdominal pain and distension for the past 2 months.    Upon evaluation in the emergency department CT abdomen/pelvis showed obstructive sigmoid mass extending approximately 9 cm in the craniocaudal dimension, findings suggestive of malignancy, high-grade large bowel obstruction with no findings of perforation.  The hospitalist group was then called to assess the patient for admission the hospital.    Patient was made n.p.o. and NG tube was placed.  General surgery was consulted general surgery was consulted and patient underwent exploratory laparotomy with Hartman's procedure on 5/27 by Dr. Magnus Ivan.   Postoperatively patient experienced no significant complications.  Electrolytes were corrected as needed and diet was slowly advanced in the days that followed.  Biopsies and lymph node dissection revealed no evidence of malignancy but instead revealed evidence of diverticulitis with perforation followed by pericolonic abscess and stricture formation.  Patient was educated on caring for and maintaining his ostomy and appropriate referrals were made for the ostomy clinic.  Patient additionally instructed to follow-up closely with both general surgery and his primary care provider.  Patient was discharged in improved and stable condition on 06/13/2022.

## 2022-06-12 NOTE — Progress Notes (Signed)
PROGRESS NOTE   Roger Mcdonald  GEX:[id number] DOB: 09-Nov-1950 DOA: 06/08/2022 PCP: [name]    Brief Narrative:  72 y.o. male with no significant past medical history and surgical history presented to the ED with abdominal pain and distension for the past 2 months.    Upon evaluation in the emergency department CT abdomen/pelvis showed obstructive sigmoid mass extending approximately 9 cm in the craniocaudal dimension, findings suggestive of malignancy, high-grade large bowel obstruction with no findings of perforation.  The hospitalist group was then called to assess the patient for admission the hospital.    Patient was made n.p.o. and NG tube was placed.  General surgery was consulted general surgery was consulted and patient underwent exploratory laparotomy with Hartman's procedure on 5/27 by Dr. Magnus Ivan.    Assessment and Plan: * Large bowel obstruction [facility]) Patient is status post exploratory laparotomy with Hartman's procedure on 5/27 Surgery continues to follow in consultation, their assistance is appreciated. Slow clinical improvement postoperatively PCA pump for analgesia has since been discontinued Slow advancement of diet, patient being advanced to full liquid diet today.  Colonic mass Status post resection 5/27 Awaiting biopsy results Remainder of assessment and plan as above  Hypokalemia replaced  Leukocytosis Likely reactive No obvious evidence of infection.    Subjective:  Patient continues to complain of abdominal pain, mild to moderate intensity, worse with movement and improved with rest.  Patient states that he is hungry.  Patient reports passing flatus today.  Physical Exam:  Vitals:   06/10/22 1933 06/10/22 2042 06/11/22 0512 06/11/22 1335  BP:  119/80 130/71 135/81  Pulse:  85 87 78  Resp: 16 18 18 16   Temp:  98.2 F (36.8 C) 98.3 F (36.8 C) 98.2 F (36.8 C)  TempSrc:  Oral Oral Oral  SpO2:  94% 96% 100%  Weight:       Height:        Constitutional: Awake alert and oriented x3, no associated distress.   Skin: no rashes, no lesions, good skin turgor noted. Eyes: Pupils are equally reactive to light.  No evidence of scleral icterus or conjunctival pallor.  ENMT: Moist mucous membranes noted.  Posterior pharynx clear of any exudate or lesions.   Respiratory: clear to auscultation bilaterally, no wheezing, no crackles. Normal respiratory effort. No accessory muscle use.  Cardiovascular: Regular rate and rhythm, no murmurs / rubs / gallops. No extremity edema. 2+ pedal pulses. No carotid bruits.  Abdomen: Notable generalized abdominal tenderness.  Ostomy draining dark brown liquid consistency stool.  Abdomen is soft.  Hypoactive bowel sounds.  Musculoskeletal: No joint deformity upper and lower extremities. Good ROM, no contractures. Normal muscle tone.    Data Reviewed:  I have personally reviewed and interpreted labs, imaging.  Significant findings are   CBC: Recent Labs  Lab 06/08/22 1556 06/09/22 0402 06/10/22 0441 06/11/22 0455  WBC 11.5* 11.4* 14.9* 13.1*  NEUTROABS 8.3*  --   --   --   HGB 15.0 15.9 14.4 15.0  HCT 44.9 48.4 43.6 45.8  MCV 88.0 89.8 89.2 89.6  PLT 299 278 289 293   Basic Metabolic Panel: Recent Labs  Lab 06/08/22 1640 06/09/22 0402 06/10/22 0441 06/11/22 0455  NA 136 136 133* 131*  K 3.6 3.1* 4.4 4.7  CL 105 106 101 96*  CO2 23 19* 24 28  GLUCOSE 114* 123* 98 105*  BUN 13 9 9 11   CREATININE 1.00 0.90 0.77 0.94  CALCIUM 8.0* 8.1* 8.0* 8.6*  MG  --  2.3  --   --    GFR: Estimated Creatinine Clearance: 79.7 mL/min (by C-G formula based on SCr of 0.94 mg/dL). Liver Function Tests: Recent Labs  Lab 06/08/22 1640  AST 20  ALT 19  ALKPHOS 55  BILITOT 0.8  PROT 6.7  ALBUMIN 3.4*     Code Status:  DNR.    Severity of Illness:  The appropriate patient status for this patient is INPATIENT. Inpatient status is judged to be reasonable and necessary in  order to provide the required intensity of service to ensure the patient's safety. The patient's presenting symptoms, physical exam findings, and initial radiographic and laboratory data in the context of their chronic comorbidities is felt to place them at high risk for further clinical deterioration. Furthermore, it is not anticipated that the patient will be medically stable for discharge from the hospital within 2 midnights of admission.   * I certify that at the point of admission it is my clinical judgment that the patient will require inpatient hospital care spanning beyond 2 midnights from the point of admission due to high intensity of service, high risk for further deterioration and high frequency of surveillance required.*  Time spent:  38 minutes  Author:  Marinda Elk MD

## 2022-06-12 NOTE — Progress Notes (Signed)
Physical Therapy Treatment Patient Details Name: Roger Mcdonald MRN: [id number] DOB: 12-29-1950 Today's Date: 06/12/2022   History of Present Illness Roger Mcdonald is a 72 y.o. male with no significant past medical history and surgical history of C-spine surgery and tonsillectomy presented to the ED 06/08/22  with abdominal pain. CT-Obstructive sigmoid mass ,suggestive of malignancy,  High-grade large bowel obstruction. S/P E XPLORATORY LAPAROTOMY, PARTIAL COLECTOMY, COLOSTOMY on 06/07/22    PT Comments    Pt ambulated 300' with RW, no loss of balance. He is performing bed mobility and transfers independently. PT goals have been met, will sign off. He is safe to ambulate in the halls independently.    Recommendations for follow up therapy are one component of a multi-disciplinary discharge planning process, led by the attending physician.  Recommendations may be updated based on patient status, additional functional criteria and insurance authorization.  Follow Up Recommendations       Assistance Recommended at Discharge Set up Supervision/Assistance  Patient can return home with the following Assistance with cooking/housework   Equipment Recommendations  None recommended by PT    Recommendations for Other Services       Precautions / Restrictions Precautions Precaution Comments: colostomy, abdominal  wound Restrictions Weight Bearing Restrictions: No     Mobility  Bed Mobility     Rolling: Modified independent (Device/Increase time) Sidelying to sit: Modified independent (Device/Increase time), HOB elevated       General bed mobility comments: used bedrail    Transfers Overall transfer level: Independent Equipment used: None Transfers: Sit to/from Stand Sit to Stand: Independent                Ambulation/Gait Ambulation/Gait assistance: Modified independent (Device/Increase time) Gait Distance (Feet): 300 Feet Assistive device: Rolling walker (2  wheels) Gait Pattern/deviations: WFL(Within Functional Limits) Gait velocity: WNL     General Gait Details: steady, no loss of balance   Stairs             Wheelchair Mobility    Modified Rankin (Stroke Patients Only)       Balance Overall balance assessment: Modified Independent                                          Cognition Arousal/Alertness: Awake/alert Behavior During Therapy: WFL for tasks assessed/performed Overall Cognitive Status: Within Functional Limits for tasks assessed                                          Exercises      General Comments        Pertinent Vitals/Pain Pain Assessment Pain Score: 3  Pain Location: abdomen with transitions Pain Descriptors / Indicators: Guarding Pain Intervention(s): Limited activity within patient's tolerance, Monitored during session, Repositioned    Home Living                          Prior Function            PT Goals (current goals can now be found in the care plan section) Acute Rehab PT Goals Patient Stated Goal: to go home PT Goal Formulation: With patient Time For Goal Achievement: 06/24/22 Potential to Achieve Goals: Good Progress towards PT goals: Goals met/education completed, patient discharged  from PT    Frequency    Min 1X/week      PT Plan Current plan remains appropriate    Co-evaluation              AM-PAC PT "6 Clicks" Mobility   Outcome Measure  Help needed turning from your back to your side while in a flat bed without using bedrails?: None Help needed moving from lying on your back to sitting on the side of a flat bed without using bedrails?: None Help needed moving to and from a bed to a chair (including a wheelchair)?: None Help needed standing up from a chair using your arms (e.g., wheelchair or bedside chair)?: None Help needed to walk in hospital room?: None Help needed climbing 3-5 steps with a railing? :  None 6 Click Score: 24    End of Session   Activity Tolerance: Patient tolerated treatment well Patient left: in bed;with call bell/phone within reach Nurse Communication: Mobility status PT Visit Diagnosis: Unsteadiness on feet (R26.81);Difficulty in walking, not elsewhere classified (R26.2);Pain     Time: 9562-1308 PT Time Calculation (min) (ACUTE ONLY): 27 min  Charges:  $Gait Training: 8-22 mins $Therapeutic Activity: 8-22 mins                     Ralene Bathe Kistler PT 06/12/2022  Acute Rehabilitation Services  Office [phone]

## 2022-06-12 NOTE — Assessment & Plan Note (Signed)
replaced

## 2022-06-12 NOTE — Assessment & Plan Note (Signed)
Likely reactive No obvious evidence of infection.

## 2022-06-12 NOTE — Care Management Important Message (Signed)
Important Message  Patient Details IM Letter given Name: Roger Mcdonald MRN: [id number] Date of Birth: 05-05-1950   Medicare Important Message Given:  Yes     Caren Macadam 06/12/2022, 11:42 AM

## 2022-06-12 NOTE — Assessment & Plan Note (Addendum)
Patient is status post exploratory laparotomy with Hartman's procedure on 5/27 Surgery continues to follow in consultation, their assistance is appreciated. Slow clinical improvement postoperatively PCA pump for analgesia has since been discontinued on 5/29 Advancing to soft diet per surgery's recommendations

## 2022-06-12 NOTE — Consult Note (Signed)
WOC Nurse ostomy follow up Stoma type/location: LLQ, end colostomy Stomal assessment/size: slightly larger than 2", budded, moist, ruddy Peristomal assessment: intact Treatment options for stomal/peristomal skin: 2" ostomy barrier ring Output; scant brown liquid; reported flatus from patient Ostomy pouching: 1pc.flat with 2" ostomy barrier ring Education provided:  Met with patient/patient's wife and patient's son Wife does not drive Explained role of ostomy nurse and creation of stoma  Explained stoma characteristics (budded, flush, color, texture, care) Demonstrated pouch change (cutting new skin barrier, measuring stoma, cleaning peristomal skin and stoma, use of barrier ring) Allowed wife to cut new skin barrier Education on emptying when 1/3 to 1/2 full and how to empty Demonstrated use of wick to clean spout  Discussed bathing, diet, gas, medication use Discussed risk of peristomal hernia Provided patient with [employer] and marked items currently using (1pc flat transparent, 1pc flat with viewing option, 2" skin barrier ring, [employer] lubrication drops Discussed pouch change frequency and emptying frequency in detail several times.  Provided (7) pouches, barrier rings for patient to prepare for DC.  Patient to have [facility] for continued support with ostomy teaching  Discussed outpatient ostomy clinic and requested referral from [facility] staff Answered patient/family questions:  where to obtain supplies; reviewed options several times Reviewed restrictions related to lifting and riding his motorcycle  Discussed in details the ways to empty the pouch, he continues to question ability to empty in the shower, however it appears he wishes to clean in the shower we discussed options for this and I have advised with agreement from his family not the best idea to empty stool into the shower, will let family make that decision.  Discussed in details potential for take down surgery, timing and  discussion with surgeon on topic.   Enrolled patient in [place] Secure Start Discharge program: Yes  WOC Nurse will follow along with you for continued support with ostomy teaching and care Landon Truax [facility] MSN, RN, [place], CNS, [place] [phone]

## 2022-06-13 DIAGNOSIS — D72829 Elevated white blood cell count, unspecified: Secondary | ICD-10-CM | POA: Diagnosis not present

## 2022-06-13 DIAGNOSIS — K5720 Diverticulitis of large intestine with perforation and abscess without bleeding: Secondary | ICD-10-CM | POA: Diagnosis not present

## 2022-06-13 DIAGNOSIS — K56609 Unspecified intestinal obstruction, unspecified as to partial versus complete obstruction: Secondary | ICD-10-CM | POA: Diagnosis not present

## 2022-06-13 DIAGNOSIS — E876 Hypokalemia: Secondary | ICD-10-CM | POA: Diagnosis not present

## 2022-06-13 LAB — COMPREHENSIVE METABOLIC PANEL
ALT: 21 U/L (ref 0–44)
AST: 20 U/L (ref 15–41)
Albumin: 3.6 g/dL (ref 3.5–5.0)
Alkaline Phosphatase: 59 U/L (ref 38–126)
Anion gap: 10 (ref 5–15)
BUN: 14 mg/dL (ref 8–23)
CO2: 24 mmol/L (ref 22–32)
Calcium: 8.8 mg/dL — ABNORMAL LOW (ref 8.9–10.3)
Chloride: 99 mmol/L (ref 98–111)
Creatinine, Ser: 1.02 mg/dL (ref 0.61–1.24)
GFR, Estimated: >60 mL/min (ref >60)
Glucose, Bld: 115 mg/dL — ABNORMAL HIGH (ref 70–99)
Potassium: 4.5 mmol/L (ref 3.5–5.1)
Sodium: 133 mmol/L — ABNORMAL LOW (ref 135–145)
Total Bilirubin: 1 mg/dL (ref 0.3–1.2)
Total Protein: 7.9 g/dL (ref 6.5–8.1)

## 2022-06-13 LAB — CBC WITH DIFFERENTIAL/PLATELET
Abs Immature Granulocytes: 0.06 10*3/uL (ref 0.00–0.07)
Basophils Absolute: 0.1 10*3/uL (ref 0.0–0.1)
Basophils Relative: 1 %
Eosinophils Absolute: 0.4 10*3/uL (ref 0.0–0.5)
Eosinophils Relative: 3 %
HCT: 48.5 % (ref 39.0–52.0)
Hemoglobin: 15.8 g/dL (ref 13.0–17.0)
Immature Granulocytes: 0 %
Lymphocytes Relative: 14 %
Lymphs Abs: 1.9 10*3/uL (ref 0.7–4.0)
MCH: 28.9 pg (ref 26.0–34.0)
MCHC: 32.6 g/dL (ref 30.0–36.0)
MCV: 88.8 fL (ref 80.0–100.0)
Monocytes Absolute: 1.4 10*3/uL — ABNORMAL HIGH (ref 0.1–1.0)
Monocytes Relative: 10 %
Neutro Abs: 10 10*3/uL — ABNORMAL HIGH (ref 1.7–7.7)
Neutrophils Relative %: 72 %
Platelets: 371 10*3/uL (ref 150–400)
RBC: 5.46 MIL/uL (ref 4.22–5.81)
RDW: 12.9 % (ref 11.5–15.5)
WBC: 13.9 10*3/uL — ABNORMAL HIGH (ref 4.0–10.5)
nRBC: 0 % (ref 0.0–0.2)

## 2022-06-13 LAB — MAGNESIUM: Magnesium: 2.3 mg/dL (ref 1.7–2.4)

## 2022-06-13 NOTE — Consult Note (Signed)
WOC Nurse ostomy follow up Stoma type/location: LLQ colostomy Stomal assessment/size: per yesterday, slightly larger than 2 inches round Peristomal assessment: Not seen today, pouch applied yesterday is intact Treatment options for stomal/peristomal skin: Skin barrier ring Output: brown effluent and flatus Ostomy pouching: 1pc flat Education provided: None today. Patient has emptied his pouch x2 since it was applied yesterday and is comfortable with this skill. He denies having and questions or issues pertaining to the stoma and is looking forward to going home. His belongings have been packed up and I inspect the contents of the smaller of the two bags to ensure he still has 7 pouches, 7 rings and all education materials. Enrolled patient in [place] Secure Start Discharge program: Yes  WOC nursing team will follow, and will remain available to this patient, the nursing and medical teams.    Thank you for inviting [place] to participate in this patient's Plan of Care.  Ladona Mow, MSN, RN, CNS, GNP, [place]eda Min, [employer], [employer] phone:  [phone]

## 2022-06-13 NOTE — Plan of Care (Signed)

## 2022-06-13 NOTE — Discharge Summary (Signed)
Physician Discharge Summary   Patient: Roger Mcdonald MRN: [id number] DOB: [birth year]  Admit date:     06/08/2022  Discharge date: 06/13/22  Discharge Physician: Marinda Elk   PCP: [facility]   Recommendations at discharge:   Please consume a soft diet as you have been doing for the past 24 hours.  We advance the diet as tolerated as guided by your surgeon. Please maintain all follow-up appointments detailed in your after visit summary. Please advance your physical activity as tolerated. Please return to the emergency department if you develop worsening abdominal pain, fever or inability to tolerate oral intake. You may take over-the-counter Tylenol or an NSAID such as ibuprofen or naproxen for mild to moderate pain. 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  Referral has been placed to enroll you in our ostomy clinic.  [facility] will also be assisting in management of your ostomy after discharge.  Discharge Diagnoses: Principal Problem:   Large bowel obstruction (HCC) Active Problems:   Abscess of sigmoid colon due to diverticulitis   Hypokalemia   Leukocytosis  Resolved Problems:   * No resolved hospital problems. *   Hospital Course: 72 y.o. male with no significant past medical history and surgical history presented to the ED with abdominal pain and distension for the past 2 months.    Upon evaluation in the emergency department CT abdomen/pelvis showed obstructive sigmoid mass extending approximately 9 cm in the craniocaudal dimension, findings suggestive of malignancy, high-grade large bowel obstruction with no findings of perforation.  The hospitalist group was then called to assess the patient for admission the hospital.    Patient was made n.p.o. and NG tube was placed.  General surgery was consulted general surgery was consulted and patient underwent exploratory laparotomy with Hartman's procedure on 5/27 by Dr. Magnus Ivan.    Postoperatively patient experienced no significant complications.  Electrolytes were corrected as needed and diet was slowly advanced in the days that followed.  Biopsies and lymph node dissection revealed no evidence of malignancy but instead revealed evidence of diverticulitis with perforation followed by pericolonic abscess and stricture formation.  Patient was educated on caring for and maintaining his ostomy and appropriate referrals were made for the ostomy clinic.  Patient additionally instructed to follow-up closely with both general surgery and his primary care provider.  Patient was discharged in improved and stable condition on 06/13/2022.    Consultants: Dr. Magnus Ivan with general surgery Procedures performed: Exploratory laparotomy with Hartmann procedure 06/09/2022 by Dr. Magnus Ivan Disposition: Home Diet recommendation: Soft diet to be advanced as tolerated.  DISCHARGE MEDICATION: Allergies as of 06/13/2022   No Known Allergies      Medication List     TAKE these medications    acetaminophen 500 MG tablet Commonly known as: TYLENOL Take 500 mg by mouth every 6 (six) hours as needed for moderate pain.   calcium carbonate 750 MG chewable tablet Commonly known as: TUMS EX Chew 1 tablet by mouth daily as needed for heartburn.        Follow-up Information     [facility].   Specialty: Home Health Services Why: [facility] will provide nursing for ostomy oversight after discharge. Contact information: [address] [place] [address] [phone]         [facility].   Why: Call to schedule a hospital follow up appointment with your primary care physician after discharge. Contact information: [address] [place] [address] [phone]         Abigail Miyamoto, MD. Schedule an appointment as soon as possible for a visit in 4 week(s).   Specialty: General Surgery Why: For post-operative  follow up Contact information: [address] [place] [address] [phone]         [facility]. Go on 06/23/2022.   Specialty: General Surgery Why: For staple removal, call to confirm appointment date/time. Contact information: [address] [place] [address] [phone]                 Discharge Exam: Ceasar Mons Weights   06/08/22 1511  Weight: 99.2 kg    Constitutional: Awake alert and oriented x3, no associated distress.   Respiratory: clear to auscultation bilaterally, no wheezing, no crackles. Normal respiratory effort. No accessory muscle use.  Cardiovascular: Regular rate and rhythm, no murmurs / rubs / gallops. No extremity edema. 2+ pedal pulses. No carotid bruits.  Abdomen: Mild generalized Donnell tenderness.  Abdomen is soft.  Ostomy is exhibiting dark brown stool output.  No evidence of intra-abdominal masses.  Positive bowel sounds noted in all quadrants.   Musculoskeletal: No joint deformity upper and lower extremities. Good ROM, no contractures. Normal muscle tone.     Condition at discharge: fair  The results of significant diagnostics from this hospitalization (including imaging, microbiology, ancillary and laboratory) are listed below for reference.   Imaging Studies: DG Abd Portable 1V  Result Date: 06/08/2022 CLINICAL DATA:  NG tube placement EXAM: PORTABLE ABDOMEN - 1 VIEW COMPARISON:  CT today FINDINGS: NG tube tip is in the proximal stomach near the GE junction. Side port is in the distal esophagus. Gaseous distention of the visualized colon. Visualized lung bases clear. IMPRESSION: NG tube tip near the GE junction with the side port in the distal esophagus. This could be advanced several cm for optimal positioning. Electronically Signed   By: Charlett Nose M.D.   On: 06/08/2022 19:21   CT ABDOMEN PELVIS W CONTRAST  Result Date: 06/08/2022 CLINICAL DATA:  Abdominal pain, acute, nonlocalized EXAM: CT  ABDOMEN AND PELVIS WITH CONTRAST TECHNIQUE: Multidetector CT imaging of the abdomen and pelvis was performed using the standard protocol following bolus administration of intravenous contrast. RADIATION DOSE REDUCTION: This exam was performed according to the departmental dose-optimization program which includes automated exposure control, adjustment of the mA and/or kV according to patient size and/or use of iterative reconstruction technique. 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 Small hiatal hernia. Hepatobiliary: Stable scattered subcentimeter and pericentimeter hypodense hepatic lesions with largest measuring 2.2 x 1.7 cm with a density of 6 Hounsfield units. These likely represent simple hepatic cysts. No gallstones, gallbladder wall thickening, or pericholecystic fluid. No biliary dilatation. Pancreas: No focal lesion. Normal pancreatic contour. No surrounding inflammatory changes. No main pancreatic ductal dilatation. Spleen: Normal in size without focal abnormality. Adrenals/Urinary Tract: No adrenal nodule bilaterally. Bilateral kidneys enhance symmetrically. Punctate calcification within the left kidney. No ureterolithiasis. No right nephrolithiasis. No hydronephrosis. No hydroureter. The urinary bladder is unremarkable. On delayed imaging, there is no urothelial wall thickening and there are no filling defects in the opacified portions of the bilateral collecting systems or ureters. Stomach/Bowel: Stomach is within normal limits. Irregular bowel wall thickening of the sigmoid colon extending approximately 9 cm in the craniocaudal dimension  with associated gaseous and liquid stool dilatation of the proximal large bowel up to a caliber of 9 cm. No evidence of small bowel wall thickening or dilatation. Appendix appears normal.  Vascular/Lymphatic: No abdominal aorta or iliac aneurysm. Severe atherosclerotic plaque of the aorta and its branches. Multiple prominent but non-enlarged pericolonic lymph nodes along the rectum and sigmoid colon (2:65). No abdominal or inguinal lymphadenopathy. Reproductive: Prominent prostate measuring up to 4.6 cm Other: No intraperitoneal free fluid. No intraperitoneal free gas. No organized fluid collection. Musculoskeletal: No abdominal wall hernia or abnormality. No suspicious lytic or blastic osseous lesions. No acute displaced fracture. Multilevel degenerative changes of the spine. IMPRESSION: 1. Obstructive sigmoid mass extending approximately 9 cm in the craniocaudal dimension. Findings suggestive of malignancy. 2. High-grade large bowel obstruction with no findings of perforation. Recommend surgical consultation. 3. Multiple prominent but non-enlarged pericolonic lymph nodes along the rectum and sigmoid colon. Recommend attention on follow-up. 4. Colonic diverticulosis with no acute diverticulitis. 5. Nonobstructive punctate right nephrolithiasis. 6. Prominent prostate. 7. Small hiatal hernia. 8.  Aortic Atherosclerosis (ICD10-I70.0). Electronically Signed   By: Tish Frederickson M.D.   On: 06/08/2022 18:21    Microbiology: Results for orders placed or performed during the hospital encounter of 06/08/22  Surgical PCR screen     Status: None   Collection Time: 06/09/22  8:33 AM   Specimen: Nasal Mucosa; Nasal Swab  Result Value Ref Range Status   MRSA, PCR NEGATIVE NEGATIVE Final   Staphylococcus aureus NEGATIVE NEGATIVE Final    Comment: (NOTE) The Xpert SA Assay (FDA approved for NASAL specimens in patients 58 years of age and older), is one component of a comprehensive surveillance program. It is not intended to diagnose infection nor to guide or monitor treatment. Performed at [facility], [address], [place], [place] [address]     Labs: CBC: Recent Labs   Lab 06/08/22 1556 06/09/22 0402 06/10/22 0441 06/11/22 0455 06/13/22 0503  WBC 11.5* 11.4* 14.9* 13.1* 13.9*  NEUTROABS 8.3*  --   --   --  10.0*  HGB 15.0 15.9 14.4 15.0 15.8  HCT 44.9 48.4 43.6 45.8 48.5  MCV 88.0 89.8 89.2 89.6 88.8  PLT 299 278 289 293 371   Basic Metabolic Panel: Recent Labs  Lab 06/08/22 1640 06/09/22 0402 06/10/22 0441 06/11/22 0455 06/13/22 0503  NA 136 136 133* 131* 133*  K 3.6 3.1* 4.4 4.7 4.5  CL 105 106 101 96* 99  CO2 23 19* 24 28 24   GLUCOSE 114* 123* 98 105* 115*  BUN 13 9 9 11 14   CREATININE 1.00 0.90 0.77 0.94 1.02  CALCIUM 8.0* 8.1* 8.0* 8.6* 8.8*  MG  --  2.3  --   --  2.3   Liver Function Tests: Recent Labs  Lab 06/08/22 1640 06/13/22 0503  AST 20 20  ALT 19 21  ALKPHOS 55 59  BILITOT 0.8 1.0  PROT 6.7 7.9  ALBUMIN 3.4* 3.6   CBG: No results for input(s): "GLUCAP" in the last 168 hours.  Discharge time spent: greater than 30 minutes.  Signed: Marinda Elk, MD [facility] 06/13/2022

## 2022-06-13 NOTE — Progress Notes (Signed)
Reviewed written discharge instructions with patient. All questions answered. He verbalized understanding. Discharged via wheelchair with all belongings in stable condition. 

## 2022-06-13 NOTE — Discharge Instructions (Addendum)
Please consume a soft diet as you have been doing for the past 24 hours.  We advance the diet as tolerated as guided by your surgeon. Please maintain all follow-up appointments detailed in your after visit summary. Please advance your physical activity as tolerated. Please return to the emergency department if you develop worsening abdominal pain, fever or inability to tolerate oral intake. You may take over-the-counter Tylenol or an NSAID such as ibuprofen or naproxen for mild to moderate pain. 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  Referral has been placed to enroll you in our ostomy clinic.  [facility] will also be assisting in management of your ostomy after discharge.

## 2022-06-13 NOTE — Progress Notes (Signed)
[facility] Progress Note  4 Days Post-Op  Subjective: CC:  Cc hunger- wants to eat. Denies nausea or vomiting. Reports his ostomy pouch was full of air this AM.    Objective: Vital signs in last 24 hours: Temp:  [98.1 F (36.7 C)-98.7 F (37.1 C)] 98.1 F (36.7 C) (05/31 0559) Pulse Rate:  [80-90] 80 (05/31 0559) Resp:  [18] 18 (05/31 0559) BP: (121-145)/(87-92) 128/87 (05/31 0559) SpO2:  [96 %-97 %] 97 % (05/31 0559) Last BM Date : 06/13/22  Intake/Output from previous day: 05/30 0701 - 05/31 0700 In: 780 [P.O.:780] Out: 0  Intake/Output this shift: Total I/O In: 480 [P.O.:480] Out: 100 [Stool:100]  PE: Gen:  Alert, NAD, cooperative  Card:  Regular rate and rhythm Pulm:  Normal effort Abd: Soft, appropriately tender, midline incision c/d/I, stoma viable - scant liquid stool in pouch GU: clear yellow urine in foley bag Skin: warm and dry, no rashes  Psych: A&Ox3   Lab Results:  Recent Labs    06/11/22 0455 06/13/22 0503  WBC 13.1* 13.9*  HGB 15.0 15.8  HCT 45.8 48.5  PLT 293 371   BMET Recent Labs    06/11/22 0455 06/13/22 0503  NA 131* 133*  K 4.7 4.5  CL 96* 99  CO2 28 24  GLUCOSE 105* 115*  BUN 11 14  CREATININE 0.94 1.02  CALCIUM 8.6* 8.8*   PT/INR No results for input(s): "LABPROT", "INR" in the last 72 hours. CMP     Component Value Date/Time   NA 133 (L) 06/13/2022 0503   K 4.5 06/13/2022 0503   CL 99 06/13/2022 0503   CO2 24 06/13/2022 0503   GLUCOSE 115 (H) 06/13/2022 0503   BUN 14 06/13/2022 0503   CREATININE 1.02 06/13/2022 0503   CALCIUM 8.8 (L) 06/13/2022 0503   PROT 7.9 06/13/2022 0503   ALBUMIN 3.6 06/13/2022 0503   AST 20 06/13/2022 0503   ALT 21 06/13/2022 0503   ALKPHOS 59 06/13/2022 0503   BILITOT 1.0 06/13/2022 0503   GFRNONAA >60 06/13/2022 0503   Lipase     Component Value Date/Time   LIPASE 31 06/08/2022 1640       Studies/Results: No results found.  Anti-infectives: Anti-infectives  (From admission, onward)    Start     Dose/Rate Route Frequency Ordered Stop   06/09/22 1123  sodium chloride 0.9 % with cefoTEtan (CEFOTAN) ADS Med       Note to Pharmacy: Gerre Pebbles A: cabinet override      06/09/22 1123 06/09/22 1127   06/09/22 1000  cefoTEtan (CEFOTAN) 2 g in sodium chloride 0.9 % 100 mL IVPB        2 g 200 mL/hr over 30 Minutes Intravenous On call to O.R. 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 Magnus Ivan - POD#4, afebrile, VSS  - path: A. COLON, SIGMOID, RESECTION:  Diverticulitis with perforation, pericolonic abscess and stricture.  Eleven benign lymph nodes (0/11).  Negative for malignancy.   - having bowel function, tolerating soft diet, feels comfortable with ostomy care - stable for discharge, does not want narcotics. Given perorated diverticulitis and stable, but not normal WBC, we discussed return precautions and signs of post-operative abscess. Clinically he has no signs of abscess. Referral to ostomy clinic made, follow up appointments made.   LOS: 5 days   I reviewed nursing notes, hospitalist notes, last 24 h  vitals and pain scores, last 48 h intake and output, last 24 h labs and trends, and last 24 h imaging results.    Roger Spangle, PA-C [facility] Please see Amion for pager number during day hours 7:00am-4:30pm

## 2022-06-27 ENCOUNTER — Ambulatory Visit (HOSPITAL_COMMUNITY)
Admission: RE | Admit: 2022-06-27 | Discharge: 2022-06-27 | Disposition: A | Discharge status: Home / Self Care | Payer: Medicare Other | Source: Ambulatory Visit | Attending: Nurse Practitioner | Admitting: Nurse Practitioner

## 2022-06-27 DIAGNOSIS — K9409 Other complications of colostomy: Secondary | ICD-10-CM | POA: Insufficient documentation

## 2022-06-27 DIAGNOSIS — K9400 Colostomy complication, unspecified: Secondary | ICD-10-CM | POA: Diagnosis not present

## 2022-06-27 DIAGNOSIS — L24B3 Irritant contact dermatitis related to fecal or urinary stoma or fistula: Secondary | ICD-10-CM | POA: Diagnosis not present

## 2022-06-27 NOTE — Progress Notes (Signed)
[facility]   Reason for visit:  LLQ colostomy Midline incision dehiscence, photo in chart HPI:  Rectal mass, later determine to be pericolonic abscess and stricture formation.  No evidence of malignancy No past medical history on file. No family history on file. No Known Allergies Current Outpatient Medications  Medication Sig Dispense Refill Last Dose   acetaminophen (TYLENOL) 500 MG tablet Take 500 mg by mouth every 6 (six) hours as needed for moderate pain.      calcium carbonate (TUMS EX) 750 MG chewable tablet Chew 1 tablet by mouth daily as needed for heartburn.      No current facility-administered medications for this encounter.   ROS  Review of Systems  Constitutional:  Positive for fatigue.  Gastrointestinal:        LLQ colostomy  Skin:  Positive for color change and wound.       Dehisced midline wound  All other systems reviewed and are negative.  Vital signs:  BP 117/79 (BP Location: Right Arm)   Pulse 86   Temp 97.9 F (36.6 C) (Oral)   Resp 18   SpO2 95%  Exam:  Physical Exam Vitals reviewed.  Constitutional:      Appearance: He is obese.  Abdominal:     Palpations: Abdomen is soft.  Skin:    General: Skin is warm and dry.     Findings: Erythema present.  Neurological:     Mental Status: He is alert and oriented to person, place, and time.  Psychiatric:        Mood and Affect: Mood normal.        Behavior: Behavior normal.     Stoma type/location:  LLQ end colostomy, flush.  Stomal separation at 9 o'clock, tension at sutures Stomal assessment/size:  1 3/4" round flush stoma Peristomal assessment:  erythema at sutures.  Some tension noted.  Protected with barrier ring.   Midline incision had 2 areas of dehiscence.  Area cleansed and Iodoform packing strip placed in open wounds.  Covered with gauze and tape.  [facility] nurse coming weekly to assess wounds. Has not completed any ostomy teaching with patient yet but has ordered supplies.  He is  low on barrier rings and I provide him with 3.  Treatment options for stomal/peristomal skin: Due to flush stoma, may benefit from convex pouch system, but want to wait for irritation at suture line to resolve before implementing convexity.  Output: soft brown stool Ostomy pouching: 1pc flat    Education provided:  Patient is changing himself.  I measure stoma, it is the same size he is cutting barrier at home.   He is not wearing filtered pouch and is experiencing flatus.  We discuss burping pouch and ordering filtered pouches.     Impression/dx  Colostomy complication Discussion  Wound care Ostomy care Ostomy supplies  will set up with [facility] once [facility] discharged.  Plan  See back as needed./     Visit time: 45 minutes.   Roger Hudson FNP-BC

## 2022-06-27 NOTE — Discharge Instructions (Signed)
Recommend filtered pouch Continue daily wound care with Iodoform gauze as ordered.

## 2022-06-30 ENCOUNTER — Ambulatory Visit (HOSPITAL_COMMUNITY): Payer: Medicare Other | Admitting: Nurse Practitioner

## 2022-07-16 ENCOUNTER — Other Ambulatory Visit (HOSPITAL_COMMUNITY): Payer: Self-pay | Admitting: Nurse Practitioner

## 2022-07-16 DIAGNOSIS — K9400 Colostomy complication, unspecified: Secondary | ICD-10-CM

## 2022-10-07 ENCOUNTER — Encounter: Payer: Self-pay | Admitting: Gastroenterology

## 2022-10-07 ENCOUNTER — Telehealth: Payer: Self-pay | Admitting: Internal Medicine

## 2022-10-07 NOTE — Telephone Encounter (Signed)
He is new to this practice. He needs to see one of our advanced practitioners to establish and set up his elective colonoscopy.  Colonoscopy could be set up with any physician. Depending upon availability, it may or may not happen before his trip to [place]. Thanks, Dr.  Marina Goodell

## 2022-10-07 NOTE — Telephone Encounter (Signed)
Dr. Marina Goodell,  Urgent referral in [facility] from [facility] for colonoscopy for ostomy takedown planning. (Records in [facility]).  Patient is leaving for [place] on 10/10/22 until mid October.  Patient stated last colonoscopy was at [facility] years ago.  Please review and advise scheduling and urgency.  Thanks Christy  Dr. Marina Goodell DOD 9/24/24AM

## 2022-11-18 ENCOUNTER — Other Ambulatory Visit (HOSPITAL_COMMUNITY): Payer: Self-pay | Admitting: Nurse Practitioner

## 2022-11-18 DIAGNOSIS — K9400 Colostomy complication, unspecified: Secondary | ICD-10-CM

## 2022-11-18 DIAGNOSIS — L24B3 Irritant contact dermatitis related to fecal or urinary stoma or fistula: Secondary | ICD-10-CM

## 2022-12-26 ENCOUNTER — Encounter: Payer: Self-pay | Admitting: Gastroenterology

## 2022-12-26 ENCOUNTER — Ambulatory Visit: Payer: No Typology Code available for payment source | Admitting: Gastroenterology

## 2022-12-26 VITALS — BP 120/80 | HR 76 | Ht 64.5 in | Wt 215.4 lb

## 2022-12-26 DIAGNOSIS — Z933 Colostomy status: Secondary | ICD-10-CM

## 2022-12-26 DIAGNOSIS — Z720 Tobacco use: Secondary | ICD-10-CM

## 2022-12-26 DIAGNOSIS — K5732 Diverticulitis of large intestine without perforation or abscess without bleeding: Secondary | ICD-10-CM | POA: Diagnosis not present

## 2022-12-26 DIAGNOSIS — F17290 Nicotine dependence, other tobacco product, uncomplicated: Secondary | ICD-10-CM

## 2022-12-26 DIAGNOSIS — K5720 Diverticulitis of large intestine with perforation and abscess without bleeding: Secondary | ICD-10-CM

## 2022-12-26 DIAGNOSIS — K56609 Unspecified intestinal obstruction, unspecified as to partial versus complete obstruction: Secondary | ICD-10-CM

## 2022-12-26 MED ORDER — NA SULFATE-K SULFATE-MG SULF 17.5-3.13-1.6 GM/177ML PO SOLN: 1.0000 | Freq: Once | ORAL | 0 refills | Status: AC

## 2022-12-26 NOTE — Progress Notes (Unsigned)
Discussed the use of AI scribe software for clinical note transcription with the patient, who gave verbal consent to proceed.  HPI : Roger Mcdonald is a 72 y.o. male who is referred to [place] by Romie Levee, MD to perform a colonoscopy prior to his ostomy takedown.  The patient presented to the emergency department in May of this year with progressive abdominal pain and distention.  A CT scan showed what appeared to be an obstructive sigmoid mass without evidence of perforation.  He underwent an emergent exploratory laparotomy with Hartman's procedure.  The resected colon did not reveal any evidence of malignancy.  It was suspected that his obstruction was secondary to diverticulitis. He has recovered well from his surgery and is now be considered for ostomy takedown.   He reported no gastrointestinal problems or pain post-surgery, and the ostomy appears to be functioning well with no rectal discharge.  The patient states that he recalls having a colonoscopy many years ago and believes polyps were removed.  He does not know where this colonoscopy was performed.  The patient is a current smoker, consuming five to six cigars daily for several years.  He does not express a desire to quit smoking. The patient expressed a desire for the surgery to be scheduled in October.   He was recently seen by ENT for a neck mass, and recommended to undergo CT of neck and FNA.   CT ABD/Pelvis Jun 08, 2022 IMPRESSION: 1. Obstructive sigmoid mass extending approximately 9 cm in the craniocaudal dimension. Findings suggestive of malignancy. 2. High-grade large bowel obstruction with no findings of perforation. Recommend surgical consultation. 3. Multiple prominent but non-enlarged pericolonic lymph nodes along the rectum and sigmoid colon. Recommend attention on follow-up. 4. Colonic diverticulosis with no acute diverticulitis. 5. Nonobstructive punctate right nephrolithiasis. 6. Prominent prostate. 7. Small  hiatal hernia. 8.  Aortic Atherosclerosis (ICD10-I70.0)   Pathology report A. COLON, SIGMOID, RESECTION:  Diverticulitis with perforation, pericolonic abscess and stricture.  Eleven benign lymph nodes (0/11).  Negative for malignancy    Past Medical History:  Diagnosis Date   Colon tumor    Diverticulitis      Past Surgical History:  Procedure Laterality Date   CERVICAL SPINE SURGERY     LAPAROTOMY N/A 06/09/2022   Procedure: EXPLORATORY LAPAROTOMY, HARTMANN PROCEDURE;  Surgeon: Abigail Miyamoto, MD;  Location: [facility];  Service: General;  Laterality: N/A;   TONSILLECTOMY     No family history on file. Social History   Tobacco Use   Smoking status: Every Day    Types: Cigarettes   Smokeless tobacco: Never  Substance Use Topics   Alcohol use: Not Currently   Drug use: Not Currently   Current Outpatient Medications  Medication Sig Dispense Refill   aspirin EC 81 MG tablet Take 325 mg by mouth as needed. Swallow whole.     calcium carbonate (TUMS EX) 750 MG chewable tablet Chew 1 tablet by mouth as needed for heartburn.     No current facility-administered medications for this visit.   No Known Allergies   Review of Systems: All systems reviewed and negative except where noted in HPI.    No results found.  Physical Exam: BP 120/80 (BP Location: Left Arm, Patient Position: Sitting, Cuff Size: Normal)   Pulse 76   Ht 5' 4.5" (1.638 m) Comment: eight measured without shoes  Wt 215 lb 6 oz (97.7 kg)   BMI 36.40 kg/m  Constitutional: Pleasant,well-developed, Caucasian male in no acute distress. HEENT:  Normocephalic and atraumatic. Conjunctivae are normal. No scleral icterus. Neck supple.  Cardiovascular: Normal rate, regular rhythm.  Pulmonary/chest: Effort normal and breath sounds normal. No wheezing, rales or rhonchi. Abdominal: Soft, nondistended, nontender. Bowel sounds active throughout. There are no masses palpable. No hepatomegaly.  Ostomy located in left  hemiabdomen, soft brown stool in bag, appliance not removed.  Large vertical midline Perrotti scar, well-healed Extremities: no edema Neurological: Alert and oriented to person place and time. Skin: Skin is warm and dry. No rashes noted. Psychiatric: Normal mood and affect. Behavior is normal.  CBC    Component Value Date/Time   WBC 13.9 (H) 06/13/2022 0503   RBC 5.46 06/13/2022 0503   HGB 15.8 06/13/2022 0503   HCT 48.5 06/13/2022 0503   PLT 371 06/13/2022 0503   MCV 88.8 06/13/2022 0503   MCH 28.9 06/13/2022 0503   MCHC 32.6 06/13/2022 0503   RDW 12.9 06/13/2022 0503   LYMPHSABS 1.9 06/13/2022 0503   MONOABS 1.4 (H) 06/13/2022 0503   EOSABS 0.4 06/13/2022 0503   BASOSABS 0.1 06/13/2022 0503    CMP     Component Value Date/Time   NA 133 (L) 06/13/2022 0503   K 4.5 06/13/2022 0503   CL 99 06/13/2022 0503   CO2 24 06/13/2022 0503   GLUCOSE 115 (H) 06/13/2022 0503   BUN 14 06/13/2022 0503   CREATININE 1.02 06/13/2022 0503   CALCIUM 8.8 (L) 06/13/2022 0503   PROT 7.9 06/13/2022 0503   ALBUMIN 3.6 06/13/2022 0503   AST 20 06/13/2022 0503   ALT 21 06/13/2022 0503   ALKPHOS 59 06/13/2022 0503   BILITOT 1.0 06/13/2022 0503   GFRNONAA >60 06/13/2022 0503       Latest Ref Rng & Units 06/13/2022    5:03 AM 06/11/2022    4:55 AM 06/10/2022    4:41 AM  CBC EXTENDED  WBC 4.0 - 10.5 K/uL 13.9  13.1  14.9   RBC 4.22 - 5.81 MIL/uL 5.46  5.11  4.89   Hemoglobin 13.0 - 17.0 g/dL 47.8  29.5  62.1   HCT 39.0 - 52.0 % 48.5  45.8  43.6   Platelets 150 - 400 K/uL 371  293  289   NEUT# 1.7 - 7.7 K/uL 10.0     Lymph# 0.7 - 4.0 K/uL 1.9         ASSESSMENT AND PLAN:  72 year old male with complicated diverticulitis status post Hartman's, needs colonoscopy prior to ostomy takedown  Diverticulitis with Colonic Obstruction status post Hartman's Experienced severe diverticulitis earlier this year, resulting in a large inflammatory mass causing colonic obstruction and near perforation.  The mass was surgically removed, and pathology confirmed diverticulitis without malignancy. Currently has an ostomy and is awaiting a colonoscopy before surgical reanastomosis.  - Schedule colonoscopy before surgical reanastomosis; will plan for evaluation per ostomy as well as Hartman's evaluation per rectum - Instruct on colonoscopy preparation, including liquid diet and bowel cleansing regimen  Tobacco Use Disorder Smokes five to six cigars daily, posing an increased risk for surgical complications and other health issues. Discussed the increased risk of complications from smoking, including poor wound healing and respiratory issues. Explained the benefits of smoking cessation, including reduced surgical risks and improved overall health. -Patient not interested in smoking cessation at this time   Follow-up - Schedule follow-up appointment after colonoscopy to discuss results and plan for surgery.        Romie Levee, MD

## 2022-12-26 NOTE — Patient Instructions (Signed)
You have been scheduled for a colonoscopy. Please follow written instructions given to you at your visit today.   Please pick up your prep supplies at the pharmacy within the next 1-3 days.  If you use inhalers (even only as needed), please bring them with you on the day of your procedure.  DO NOT TAKE 7 DAYS PRIOR TO TEST- Trulicity (dulaglutide) Ozempic, Wegovy (semaglutide) Mounjaro (tirzepatide) Bydureon Bcise (exanatide extended release)  DO NOT TAKE 1 DAY PRIOR TO YOUR TEST Rybelsus (semaglutide) Adlyxin (lixisenatide) Victoza (liraglutide) Byetta (exanatide) ___________________________________________________________________________   _______________________________________________________  If your blood pressure at your visit was 140/90 or greater, please contact your primary care physician to follow up on this.  _______________________________________________________  If you are age 81 or older, your body mass index should be between 23-30. Your Body mass index is 36.4 kg/m. If this is out of the aforementioned range listed, please consider follow up with your Primary Care Provider.  If you are age 78 or younger, your body mass index should be between 19-25. Your Body mass index is 36.4 kg/m. If this is out of the aformentioned range listed, please consider follow up with your Primary Care Provider.   ________________________________________________________  The [facility] providers would like to encourage you to use [facility] to communicate with providers for non-urgent requests or questions.  Due to long hold times on the telephone, sending your provider a message by [facility] may be a faster and more efficient way to get a response.  Please allow 48 business hours for a response.  Please remember that this is for non-urgent requests.   It was a pleasure to see you today!  Thank you for trusting me with your gastrointestinal care!    Scott E.Tomasa Rand, MD

## 2023-01-26 ENCOUNTER — Encounter: Payer: Self-pay | Admitting: Gastroenterology

## 2023-02-02 ENCOUNTER — Encounter: Payer: Self-pay | Admitting: Gastroenterology

## 2023-02-02 ENCOUNTER — Ambulatory Visit: Payer: No Typology Code available for payment source | Admitting: Gastroenterology

## 2023-02-02 VITALS — BP 110/80 | HR 72 | Temp 97.9°F | Resp 10 | Ht 64.0 in | Wt 214.0 lb

## 2023-02-02 DIAGNOSIS — Z933 Colostomy status: Secondary | ICD-10-CM | POA: Diagnosis not present

## 2023-02-02 DIAGNOSIS — K5730 Diverticulosis of large intestine without perforation or abscess without bleeding: Secondary | ICD-10-CM | POA: Diagnosis not present

## 2023-02-02 DIAGNOSIS — Z01818 Encounter for other preprocedural examination: Secondary | ICD-10-CM | POA: Diagnosis present

## 2023-02-02 DIAGNOSIS — Z980 Intestinal bypass and anastomosis status: Secondary | ICD-10-CM | POA: Diagnosis not present

## 2023-02-02 DIAGNOSIS — D123 Benign neoplasm of transverse colon: Secondary | ICD-10-CM | POA: Diagnosis not present

## 2023-02-02 MED ORDER — SODIUM CHLORIDE 0.9 % IV SOLN: 500.0000 mL | Freq: Once | INTRAVENOUS | Status: DC

## 2023-02-02 NOTE — Patient Instructions (Signed)
Please read handouts provided. Continue present medications. Await pathology results. Follow up with surgery to schedule ostomy takedown/reversal.   YOU HAD AN ENDOSCOPIC PROCEDURE TODAY AT THE [facility]:   Refer to the procedure report that was given to you for any specific questions about what was found during the examination.  If the procedure report does not answer your questions, please call your gastroenterologist to clarify.  If you requested that your care partner not be given the details of your procedure findings, then the procedure report has been included in a sealed envelope for you to review at your convenience later.  YOU SHOULD EXPECT: Some feelings of bloating in the abdomen. Passage of more gas than usual.  Walking can help get rid of the air that was put into your GI tract during the procedure and reduce the bloating. If you had a lower endoscopy (such as a colonoscopy or flexible sigmoidoscopy) you may notice spotting of blood in your stool or on the toilet paper. If you underwent a bowel prep for your procedure, you may not have a normal bowel movement for a few days.  Please Note:  You might notice some irritation and congestion in your nose or some drainage.  This is from the oxygen used during your procedure.  There is no need for concern and it should clear up in a day or so.  SYMPTOMS TO REPORT IMMEDIATELY:  Following lower endoscopy (colonoscopy or flexible sigmoidoscopy):  Excessive amounts of blood in the stool  Significant tenderness or worsening of abdominal pains  Swelling of the abdomen that is new, acute  Fever of 100F or higher.  For urgent or emergent issues, a gastroenterologist can be reached at any hour by calling [phone]. Do not use MyChart messaging for urgent concerns.    DIET:  We do recommend a small meal at first, but then you may proceed to your regular diet.  Drink plenty of fluids but you should avoid alcoholic beverages  for 24 hours.  ACTIVITY:  You should plan to take it easy for the rest of today and you should NOT DRIVE or use heavy machinery until tomorrow (because of the sedation medicines used during the test).    FOLLOW UP: Our staff will call the number listed on your records the next business day following your procedure.  We will call around 7:15- 8:00 am to check on you and address any questions or concerns that you may have regarding the information given to you following your procedure. If we do not reach you, we will leave a message.     If any biopsies were taken you will be contacted by phone or by letter within the next 1-3 weeks.  Please call us at [phone] if you have not heard about the biopsies in 3 weeks.    SIGNATURES/CONFIDENTIALITY: You and/or your care partner have signed paperwork which will be entered into your electronic medical record.  These signatures attest to the fact that that the information above on your After Visit Summary has been reviewed and is understood.  Full responsibility of the confidentiality of this discharge information lies with you and/or your care-partner.

## 2023-02-02 NOTE — Progress Notes (Unsigned)
Called to room to assist during endoscopic procedure.  Patient ID and intended procedure confirmed with present staff. Received instructions for my participation in the procedure from the performing physician.  

## 2023-02-02 NOTE — Progress Notes (Unsigned)
Pt's states no medical or surgical changes since previsit or office visit. 

## 2023-02-02 NOTE — Progress Notes (Unsigned)
[facility] History and Physical   Primary Care Physician:  Raquel James, MD   Reason for Procedure:  Colonoscopy prior to ostomy takedown  Plan:    Colonoscopy     HPI: Roger Mcdonald is a 73 y.o. male undergoing initial colonoscopy prior to anticipated ostomy takedown. The patient presented to the emergency department in May 2024 with progressive abdominal pain and distention. A CT scan showed what appeared to be an obstructive sigmoid mass without evidence of perforation. He underwent an emergent exploratory laparotomy with Hartman's procedure. The resected colon did not reveal any evidence of malignancy. It was suspected that his obstruction was secondary to diverticulitis.   He has no family history of colon cancer and no chronic GI symptoms.   The patient states that he recalls having a colonoscopy many years ago and believes polyps were removed.     Past Medical History:  Diagnosis Date   Colon tumor    Diverticulitis     Past Surgical History:  Procedure Laterality Date   CERVICAL SPINE SURGERY     LAPAROTOMY N/A 06/09/2022   Procedure: EXPLORATORY LAPAROTOMY, HARTMANN PROCEDURE;  Surgeon: Abigail Miyamoto, MD;  Location: [facility];  Service: General;  Laterality: N/A;   TONSILLECTOMY      Prior to Admission medications   Medication Sig Start Date End Date Taking? Authorizing Provider  aspirin EC 81 MG tablet Take 325 mg by mouth as needed. Swallow whole.    [provider]  calcium carbonate (TUMS EX) 750 MG chewable tablet Chew 1 tablet by mouth as needed for heartburn.    [provider]    Current Outpatient Medications  Medication Sig Dispense Refill   aspirin EC 81 MG tablet Take 325 mg by mouth as needed. Swallow whole.     calcium carbonate (TUMS EX) 750 MG chewable tablet Chew 1 tablet by mouth as needed for heartburn.     Current Facility-Administered Medications  Medication Dose Route Frequency Provider Last Rate Last Admin    0.9 %  sodium chloride infusion  500 mL Intravenous Once Jenel Lucks, MD        Allergies as of 02/02/2023   (No Known Allergies)    Family History  Problem Relation Age of Onset   Hypertension Mother    Diabetes Mother    Colon cancer Neg Hx    Esophageal cancer Neg Hx    Stomach cancer Neg Hx    Rectal cancer Neg Hx     Social History   Socioeconomic History   Marital status: Married    Spouse name: Not on file   Number of children: 3   Years of education: Not on file   Highest education level: Not on file  Occupational History   Occupation: retired  Tobacco Use   Smoking status: Every Day    Types: Cigars, Cigarettes   Smokeless tobacco: Never   Tobacco comments:    Quit cigarettes years ago still smoke cigars  Vaping Use   Vaping status: Never Used  Substance and Sexual Activity   Alcohol use: Yes    Comment: 1 beer or wine daily   Drug use: Not Currently   Sexual activity: Not on file  Other Topics Concern   Not on file  Social History Narrative   Not on file   Social Drivers of Health   Financial Resource Strain: Not on file  Food Insecurity: Low Risk  (11/03/2022)   Received from [facility]   Hunger  Vital Sign    Worried About Programme researcher, broadcasting/film/video in the Last Year: Never true    Ran Out of Food in the Last Year: Never true  Transportation Needs: No Transportation Needs (11/03/2022)   Received from [facility]    In the past 12 months, has lack of reliable transportation kept you from medical appointments, meetings, work or from getting things needed for daily living? : No  Physical Activity: Not on file  Stress: Not on file  Social Connections: Not on file  Intimate Partner Violence: Not At Risk (06/09/2022)   Humiliation, Afraid, Rape, and Kick questionnaire    Fear of Current or Ex-Partner: No    Emotionally Abused: No    Physically Abused: No    Sexually Abused: No    Review of Systems:  All other review of  systems negative except as mentioned in the HPI.  Physical Exam: Vital signs BP 126/84   Pulse 64   Temp 97.9 F (36.6 C)   Ht 5\' 4"  (1.626 m)   Wt 214 lb (97.1 kg)   SpO2 98%   BMI 36.73 kg/m   General:   Alert,  Well-developed, well-nourished, pleasant and cooperative in NAD Airway:  Mallampati 2 Lungs:  Clear throughout to auscultation.   Heart:  Regular rate and rhythm; no murmurs, clicks, rubs,  or gallops. Abdomen:  Soft, nontender and nondistended. Normal bowel sounds.   Neuro/Psych:  Normal mood and affect. A and O x 3   Fisher Hargadon E. Tomasa Rand, MD [facility]

## 2023-02-02 NOTE — Op Note (Signed)
[facility] Patient Name: Roger Mcdonald Procedure Date: 02/02/2023 3:59 PM MRN: [id number] Endoscopist: Lorin Picket E. Tomasa Rand , MD, [id number] Age: 73 Referring MD:  Date of Birth: October 31, 1950 Gender: Male Account #: [id number] Procedure:                Colonoscopy via Stoma with Endoscopy of Hartmann                            Pouch Indications:              Preoperative assessment for reversal of Hartmann                            pouch Medicines:                Monitored Anesthesia Care Procedure:                Pre-Anesthesia Assessment:                           - Prior to the procedure, a History and Physical                            was performed, and patient medications and                            allergies were reviewed. The patient's tolerance of                            previous anesthesia was also reviewed. The risks                            and benefits of the procedure and the sedation                            options and risks were discussed with the patient.                            All questions were answered, and informed consent                            was obtained. Prior Anticoagulants: The patient has                            taken no anticoagulant or antiplatelet agents. ASA                            Grade Assessment: II - A patient with mild systemic                            disease. After reviewing the risks and benefits,                            the patient was deemed in satisfactory condition to  undergo the procedure.                           After obtaining informed consent, the endoscope was                            passed under direct vision. Throughout the                            procedure, the patient's blood pressure, pulse, and                            oxygen saturations were monitored continuously. The                            PCF-H190TL Slim SN [id number] was introduced through                             the descending colostomy and advanced to the the                            cecum, identified by appendiceal orifice and                            ileocecal valve. After obtaining informed consent,                            the endoscope was passed under direct vision.                            Throughout the procedure, the patient's blood                            pressure, pulse, and oxygen saturations were                            monitored continuously. The PCF-H190TL Slim SN                            [id number] was introduced through the anus and                            advanced to the [facility] pouch. The procedure was                            performed without difficulty. The patient tolerated                            the procedure well. The quality of the bowel                            preparation was adequate. Scope In: 4:16:30 PM Scope Out: 4:37:21 PM Scope Withdrawal Time: 0 hours 13 minutes 17 seconds  Total Procedure Duration: 0 hours 20 minutes 51 seconds  Findings:                 The perianal and digital rectal examinations were                            normal. Pertinent negatives include normal                            sphincter tone and no palpable rectal lesions.                           Multiple large mucus balls were found in the                            [facility] pouch, interfering with visualization to a                            small degree.                           An 8 mm polyp was found in the proximal transverse                            colon. The polyp was semi-pedunculated. The polyp                            was removed with a cold snare. Resection and                            retrieval were complete. Estimated blood loss was                            minimal.                           Multiple medium-mouthed and small-mouthed                            diverticula were found in the descending colon and                             transverse colon.                           The exam was otherwise normal throughout the                            examined colon.                           The exam was otherwise without abnormality.                           Patient is status-post sigmoid colectomy with a  surgical anastomosis. Complications:            No immediate complications. Estimated Blood Loss:     Estimated blood loss was minimal. Impression:               - Mucus balls within the [facility] pouch, otherwise                            normal pouch.                           - One 8 mm polyp in the proximal transverse colon,                            removed with a cold snare. Resected and retrieved.                           - Diverticulosis in the descending colon and in the                            transverse colon.                           - The examination was otherwise normal. Recommendation:           - Discharge patient to home (with escort).                           - Patient has a contact number available for                            emergencies. The signs and symptoms of potential                            delayed complications were discussed with the                            patient. Return to normal activities tomorrow.                            Written discharge instructions were provided to the                            patient.                           - Resume previous diet.                           - Continue present medications.                           - Await pathology results.                           - Follow up with surgery to schedule ostomy  takedown/reversal.                           - Given patient's age and lack of high risk polyps,                            I would recommend against any further colon cancer                            screening/polyp surveillance Ramyah Pankowski E. Tomasa Rand, MD 02/02/2023  4:51:16 PM This report has been signed electronically.

## 2023-02-02 NOTE — Progress Notes (Unsigned)
Sedate, gd SR, tolerated procedure well, VSS, report to RN 

## 2023-02-03 ENCOUNTER — Telehealth: Payer: Self-pay

## 2023-02-03 NOTE — Telephone Encounter (Signed)
  Follow up Call-     02/02/2023    3:06 PM  Call back number  Post procedure Call Back phone  # [phone]  Permission to leave phone message Yes     Patient questions:  Do you have a fever, pain , or abdominal swelling? No. Pain Score  0 *  Have you tolerated food without any problems? Yes.    Have you been able to return to your normal activities? Yes.    Do you have any questions about your discharge instructions: Diet   No. Medications  No. Follow up visit  No.  Do you have questions or concerns about your Care? No.  Actions: * If pain score is 4 or above: No action needed, pain <4.

## 2023-02-05 LAB — SURGICAL PATHOLOGY

## 2023-02-06 ENCOUNTER — Encounter: Payer: Self-pay | Admitting: Gastroenterology

## 2023-02-24 ENCOUNTER — Ambulatory Visit: Payer: Self-pay | Admitting: General Surgery

## 2023-02-24 NOTE — H&P (View-Only) (Signed)
 PROVIDER:  Elenora Gamma, MD  MRN: [id number] DOB: [birth year] DATE OF ENCOUNTER: 02/24/2023 Interval History:   73 year old male who underwent open Hartman's procedure for obstructing sigmoid mass.  This was done in late May 2024.  He has been recovering since then. Final pathology showed diverticulitis with perforation and pericolonic abscess.  He is eating well and having good bowel function.  He underwent colonoscopy on 02/02/23.  One polyp was removed.  The exam was otherwise normal.     Past Medical History:  Diagnosis Date   Colon tumor    Diverticulitis    Past Surgical History:  Procedure Laterality Date   CERVICAL SPINE SURGERY     LAPAROTOMY N/A 06/09/2022   Procedure: EXPLORATORY LAPAROTOMY, HARTMANN PROCEDURE;  Surgeon: Abigail Miyamoto, MD;  Location: [facility];  Service: General;  Laterality: N/A;   TONSILLECTOMY     Family History  Problem Relation Age of Onset   Hypertension Mother    Diabetes Mother    Colon cancer Neg Hx    Esophageal cancer Neg Hx    Stomach cancer Neg Hx    Rectal cancer Neg Hx    Social History   Socioeconomic History   Marital status: Married    Spouse name: Not on file   Number of children: 3   Years of education: Not on file   Highest education level: Not on file  Occupational History   Occupation: retired  Tobacco Use   Smoking status: Every Day    Types: Cigars, Cigarettes   Smokeless tobacco: Never   Tobacco comments:    Quit cigarettes years ago still smoke cigars  Vaping Use   Vaping status: Never Used  Substance and Sexual Activity   Alcohol use: Yes    Comment: 1 beer or wine daily   Drug use: Not Currently   Sexual activity: Not on file  Other Topics Concern   Not on file  Social History Narrative   Not on file   Social Drivers of Health   Financial Resource Strain: Not on file  Food Insecurity: Low Risk  (11/03/2022)   Received from [facility]   Hunger Vital Sign    Worried About Running Out of  Food in the Last Year: Never true    Ran Out of Food in the Last Year: Never true  Transportation Needs: No Transportation Needs (11/03/2022)   Received from [facility]    In the past 12 months, has lack of reliable transportation kept you from medical appointments, meetings, work or from getting things needed for daily living? : No  Physical Activity: Not on file  Stress: Not on file  Social Connections: Not on file  Intimate Partner Violence: Not At Risk (06/09/2022)   Humiliation, Afraid, Rape, and Kick questionnaire    Fear of Current or Ex-Partner: No    Emotionally Abused: No    Physically Abused: No    Sexually Abused: No    Current Outpatient Medications:    aspirin EC 81 MG tablet, Take 325 mg by mouth as needed. Swallow whole., Disp: , Rfl:    calcium carbonate (TUMS EX) 750 MG chewable tablet, Chew 1 tablet by mouth as needed for heartburn., Disp: , Rfl:  No Known Allergies   Review of Systems - Negative except as stated above   Physical Examination:   Physical Exam   He appears well on exam.   His abd is soft and his ostomy is pink and well-perfused  He appears to have an incisional hernia and a parastomal hernia  Assessment and Plan:     Roger Mcdonald is a 73 y.o. male who underwent exploratory laparotomy with partial colectomy and colostomy for diverticulitis in late May of this 2024.  Diagnoses and all orders for this visit:  Diverticular stricture (CMS/HHS-HCC)     At this point he has recovered from surgery and completed his colonoscopy.  We discussed minimally invasive colostomy reversal.     The surgery and anatomy were described to the patient as well as the risks of surgery and the possible complications.  These include: Bleeding, deep abdominal infections and possible wound complications such as hernia and infection, damage to adjacent structures, leak of surgical connections, which can lead to other surgeries and possibly an ostomy,  possible need for other procedures, such as abscess drains in radiology, possible prolonged hospital stay, possible diarrhea from removal of part of the colon, possible constipation from narcotics, possible bowel, bladder or sexual dysfunction if having rectal surgery, prolonged fatigue/weakness or appetite loss, possible early recurrence of of disease, possible complications of their medical problems such as heart disease or arrhythmias or lung problems, death (less than 1%). I believe the patient understands and wishes to proceed with the surgery.     The plan was discussed in detail with the patient today, who expressed understanding.  The patient has my contact information, and understands to call me with any additional questions or concerns in the interval.  I would be happy to see the patient back sooner if the need arises.   Vanita Panda, MD Colon and Rectal Surgery [facility]

## 2023-02-24 NOTE — H&P (Signed)
PROVIDER:  Elenora Gamma, MD  MRN: [id number] DOB: [birth year] DATE OF ENCOUNTER: 02/24/2023 Interval History:   73 year old male who underwent open Hartman's procedure for obstructing sigmoid mass.  This was done in late May 2024.  He has been recovering since then. Final pathology showed diverticulitis with perforation and pericolonic abscess.  He is eating well and having good bowel function.  He underwent colonoscopy on 02/02/23.  One polyp was removed.  The exam was otherwise normal.     Past Medical History:  Diagnosis Date   Colon tumor    Diverticulitis    Past Surgical History:  Procedure Laterality Date   CERVICAL SPINE SURGERY     LAPAROTOMY N/A 06/09/2022   Procedure: EXPLORATORY LAPAROTOMY, HARTMANN PROCEDURE;  Surgeon: Abigail Miyamoto, MD;  Location: [facility];  Service: General;  Laterality: N/A;   TONSILLECTOMY     Family History  Problem Relation Age of Onset   Hypertension Mother    Diabetes Mother    Colon cancer Neg Hx    Esophageal cancer Neg Hx    Stomach cancer Neg Hx    Rectal cancer Neg Hx    Social History   Socioeconomic History   Marital status: Married    Spouse name: Not on file   Number of children: 3   Years of education: Not on file   Highest education level: Not on file  Occupational History   Occupation: retired  Tobacco Use   Smoking status: Every Day    Types: Cigars, Cigarettes   Smokeless tobacco: Never   Tobacco comments:    Quit cigarettes years ago still smoke cigars  Vaping Use   Vaping status: Never Used  Substance and Sexual Activity   Alcohol use: Yes    Comment: 1 beer or wine daily   Drug use: Not Currently   Sexual activity: Not on file  Other Topics Concern   Not on file  Social History Narrative   Not on file   Social Drivers of Health   Financial Resource Strain: Not on file  Food Insecurity: Low Risk  (11/03/2022)   Received from [facility]   Hunger Vital Sign    Worried About Running Out of  Food in the Last Year: Never true    Ran Out of Food in the Last Year: Never true  Transportation Needs: No Transportation Needs (11/03/2022)   Received from [facility]    In the past 12 months, has lack of reliable transportation kept you from medical appointments, meetings, work or from getting things needed for daily living? : No  Physical Activity: Not on file  Stress: Not on file  Social Connections: Not on file  Intimate Partner Violence: Not At Risk (06/09/2022)   Humiliation, Afraid, Rape, and Kick questionnaire    Fear of Current or Ex-Partner: No    Emotionally Abused: No    Physically Abused: No    Sexually Abused: No    Current Outpatient Medications:    aspirin EC 81 MG tablet, Take 325 mg by mouth as needed. Swallow whole., Disp: , Rfl:    calcium carbonate (TUMS EX) 750 MG chewable tablet, Chew 1 tablet by mouth as needed for heartburn., Disp: , Rfl:  No Known Allergies   Review of Systems - Negative except as stated above   Physical Examination:   Physical Exam   He appears well on exam.   His abd is soft and his ostomy is pink and well-perfused  He appears to have an incisional hernia and a parastomal hernia  Assessment and Plan:     Roger Mcdonald is a 73 y.o. male who underwent exploratory laparotomy with partial colectomy and colostomy for diverticulitis in late May of this 2024.  Diagnoses and all orders for this visit:  Diverticular stricture (CMS/HHS-HCC)     At this point he has recovered from surgery and completed his colonoscopy.  We discussed minimally invasive colostomy reversal.     The surgery and anatomy were described to the patient as well as the risks of surgery and the possible complications.  These include: Bleeding, deep abdominal infections and possible wound complications such as hernia and infection, damage to adjacent structures, leak of surgical connections, which can lead to other surgeries and possibly an ostomy,  possible need for other procedures, such as abscess drains in radiology, possible prolonged hospital stay, possible diarrhea from removal of part of the colon, possible constipation from narcotics, possible bowel, bladder or sexual dysfunction if having rectal surgery, prolonged fatigue/weakness or appetite loss, possible early recurrence of of disease, possible complications of their medical problems such as heart disease or arrhythmias or lung problems, death (less than 1%). I believe the patient understands and wishes to proceed with the surgery.     The plan was discussed in detail with the patient today, who expressed understanding.  The patient has my contact information, and understands to call me with any additional questions or concerns in the interval.  I would be happy to see the patient back sooner if the need arises.   Vanita Panda, MD Colon and Rectal Surgery [facility]

## 2023-03-02 NOTE — Patient Instructions (Signed)
SURGICAL WAITING ROOM VISITATION  Patients having surgery or a procedure may have no more than 2 support people in the waiting area - these visitors may rotate.    Children under the age of 17 must have an adult with them who is not the patient.  Due to an increase in RSV and influenza rates and associated hospitalizations, children ages 44 and under may not visit patients in [facility] hospitals.  Visitors with respiratory illnesses are discouraged from visiting and should remain at home.  If the patient needs to stay at the hospital during part of their recovery, the visitor guidelines for inpatient rooms apply. Pre-op nurse will coordinate an appropriate time for 1 support person to accompany patient in pre-op.  This support person may not rotate.    Please refer to the [facility] website for the visitor guidelines for Inpatients (after your surgery is over and you are in a regular room).       Your procedure is scheduled on: 03/12/23   Report to [facility] Main Entrance    Report to admitting at 5:15 AM   Call this number if you have problems the morning of surgery [phone]   Do not eat food :After Midnight.   After Midnight you may have the following liquids until 4:30 AM DAY OF SURGERY  Water Non-Citrus Juices (without pulp, NO RED-Apple, White grape, White cranberry) Black Coffee (NO MILK/CREAM OR CREAMERS, sugar ok)  Clear Tea (NO MILK/CREAM OR CREAMERS, sugar ok) regular and decaf                             Plain Jell-O (NO RED)                                           Fruit ices (not with fruit pulp, NO RED)                                     Popsicles (NO RED)                                                               Sports drinks like Gatorade (NO RED)              Drink 2 Ensure/G2 drinks AT 10:00 PM the night before surgery.        The day of surgery:  Drink ONE (1) Pre-Surgery Clear Ensure  at 4:30 AM the morning of surgery. Drink in one  sitting. Do not sip.  This drink was given to you during your hospital  pre-op appointment visit. Nothing else to drink after completing the  Pre-Surgery Clear Ensure .   FOLLOW BOWEL PREP AND ANY ADDITIONAL PRE OP INSTRUCTIONS YOU RECEIVED FROM YOUR SURGEON'S OFFICE!!!     Oral Hygiene is also important to reduce your risk of infection.  Remember - BRUSH YOUR TEETH THE MORNING OF SURGERY WITH YOUR REGULAR TOOTHPASTE  DENTURES WILL BE REMOVED PRIOR TO SURGERY PLEASE DO NOT APPLY "Poly grip" OR ADHESIVES!!!   Do NOT smoke after Midnight   Stop all vitamins and herbal supplements 7 days before surgery.   Take these medicines the morning of surgery with A SIP OF WATER:              You may not have any metal on your body including hair pins, jewelry, and body piercing             Do not wear make-up, lotions, powders, perfumes/cologne, or deodorant              Men may shave face and neck.   Do not bring valuables to the hospital. [place] IS NOT             RESPONSIBLE   FOR VALUABLES.   Contacts, glasses, dentures or bridgework may not be worn into surgery.   Bring small overnight bag day of surgery.   DO NOT BRING YOUR HOME MEDICATIONS TO THE HOSPITAL. PHARMACY WILL DISPENSE MEDICATIONS LISTED ON YOUR MEDICATION LIST TO YOU DURING YOUR ADMISSION IN THE HOSPITAL!    Patients discharged on the day of surgery will not be allowed to drive home.  Someone NEEDS to stay with you for the first 24 hours after anesthesia.   Special Instructions: Bring a copy of your healthcare power of attorney and living will documents the day of surgery if you haven't scanned them before.              Please read over the following fact sheets you were given: IF YOU HAVE QUESTIONS ABOUT YOUR PRE-OP INSTRUCTIONS PLEASE CALL [phone] [place] [place]   If you received a COVID test during your pre-op visit  it is requested that you wear a mask when out in public, stay  away from anyone that may not be feeling well and notify your surgeon if you develop symptoms. If you test positive for Covid or have been in contact with anyone that has tested positive in the last 10 days please notify you surgeon.    [facility] - Preparing for Surgery Before surgery, you can play an important role.  Because skin is not sterile, your skin needs to be as free of germs as possible.  You can reduce the number of germs on your skin by washing with CHG (chlorahexidine gluconate) soap before surgery.  CHG is an antiseptic cleaner which kills germs and bonds with the skin to continue killing germs even after washing. Please DO NOT use if you have an allergy to CHG or antibacterial soaps.  If your skin becomes reddened/irritated stop using the CHG and inform your nurse when you arrive at Short Stay. Do not shave (including legs and underarms) for at least 48 hours prior to the first CHG shower.  You may shave your face/neck.  Please follow these instructions carefully:  1.  Shower with CHG Soap the night before surgery and the  morning of surgery.  2.  If you choose to wash your hair, wash your hair first as usual with your normal  shampoo.  3.  After you shampoo, rinse your hair and body thoroughly to remove the shampoo.                             4.  Use CHG as  you would any other liquid soap.  You can apply chg directly to the skin and wash.  Gently with a scrungie or clean washcloth.  5.  Apply the CHG Soap to your body ONLY FROM THE NECK DOWN.   Do   not use on face/ open                           Wound or open sores. Avoid contact with eyes, ears mouth and   genitals (private parts).                       Wash face,  Genitals (private parts) with your normal soap.             6.  Wash thoroughly, paying special attention to the area where your    surgery  will be performed.  7.  Thoroughly rinse your body with warm water from the neck down.  8.  DO NOT shower/wash with your normal  soap after using and rinsing off the CHG Soap.                9.  Pat yourself dry with a clean towel.            10.  Wear clean pajamas.            11.  Place clean sheets on your bed the night of your first shower and do not  sleep with pets. Day of Surgery : Do not apply any lotions/deodorants the morning of surgery.  Please wear clean clothes to the hospital/surgery center.  FAILURE TO FOLLOW THESE INSTRUCTIONS MAY RESULT IN THE CANCELLATION OF YOUR SURGERY  PATIENT SIGNATURE_________________________________  NURSE SIGNATURE__________________________________  Incentive Spirometer   An incentive spirometer is a tool that can help keep your lungs clear and active. This tool measures how well you are filling your lungs with each breath. Taking long deep breaths may help reverse or decrease the chance of developing breathing (pulmonary) problems (especially infection) following: A long period of time when you are unable to move or be active. BEFORE THE PROCEDURE  If the spirometer includes an indicator to show your best effort, your nurse or respiratory therapist will set it to a desired goal. If possible, sit up straight or lean slightly forward. Try not to slouch. Hold the incentive spirometer in an upright position. INSTRUCTIONS FOR USE  Sit on the edge of your bed if possible, or sit up as far as you can in bed or on a chair. Hold the incentive spirometer in an upright position. Breathe out normally. Place the mouthpiece in your mouth and seal your lips tightly around it. Breathe in slowly and as deeply as possible, raising the piston or the ball toward the top of the column. Hold your breath for 3-5 seconds or for as long as possible. Allow the piston or ball to fall to the bottom of the column. Remove the mouthpiece from your mouth and breathe out normally. Rest for a few seconds and repeat Steps 1 through 7 at least 10 times every 1-2 hours when you are awake. Take your time and  take a few normal breaths between deep breaths. The spirometer may include an indicator to show your best effort. Use the indicator as a goal to work toward during each repetition. After each set of 10 deep breaths, practice coughing to be sure your lungs are clear. If you have  an incision (the cut made at the time of surgery), support your incision when coughing by placing a pillow or rolled up towels firmly against it. Once you are able to get out of bed, walk around indoors and cough well. You may stop using the incentive spirometer when instructed by your caregiver.  RISKS AND COMPLICATIONS Take your time so you do not get dizzy or light-headed. If you are in pain, you may need to take or ask for pain medication before doing incentive spirometry. It is harder to take a deep breath if you are having pain. AFTER USE Rest and breathe slowly and easily. It can be helpful to keep track of a log of your progress. Your caregiver can provide you with a simple table to help with this. If you are using the spirometer at home, follow these instructions: SEEK MEDICAL CARE IF:  You are having difficultly using the spirometer. You have trouble using the spirometer as often as instructed. Your pain medication is not giving enough relief while using the spirometer. You develop fever of 100.5 F (38.1 C) or higher. SEEK IMMEDIATE MEDICAL CARE IF:  You cough up bloody sputum that had not been present before. You develop fever of 102 F (38.9 C) or greater. You develop worsening pain at or near the incision site. MAKE SURE YOU:  Understand these instructions. Will watch your condition. Will get help right away if you are not doing well or get worse. Document Released: 05/12/2006 Document Revised: 03/24/2011 Document Reviewed: 07/13/2006 [facility] Patient Information 2014 [place], [place].

## 2023-03-02 NOTE — Progress Notes (Signed)
COVID Vaccine received:  [x]  No []  Yes Date of any COVID positive Test in last 90 days: no PCP - Raquel James MD Cardiologist - n/a  Chest x-ray -  EKG -  06/09/22 Epic Stress Test -  ECHO -  Cardiac Cath -   Bowel Prep - [x]  No  []   Yes ______  Pacemaker / ICD devic[x] e  No []  Yes   Spinal Cord Stimulator:[x]  No []  Yes       History of Sleep Apnea? [x]  No []  Yes   CPAP used?- [x]  No []  Yes    Does the patient monitor blood sugar?          [x]  No []  Yes  []  N/A  Patient has: [x]  NO Hx DM   []  Pre-DM                 []  DM1  []   DM2 Does patient have a [employer] or Dexacom? []  No []  Yes   Fasting Blood Sugar Ranges-  Checks Blood Sugar _____ times a day  GLP1 agonist / usual dose - no GLP1 instructions:  SGLT-2 inhibitors / usual dose - no SGLT-2 instructions:   Blood Thinner / Instructions:no Aspirin Instructions:no Comments:   Activity level: Patient is able / unable to climb a flight of stairs without difficulty; [x]  No CP  [x]  No SOB, _   Patient can perform ADLs without assistance.   Anesthesia review:   Patient denies shortness of breath, fever, cough and chest pain at PAT appointment.  Patient verbalized understanding and agreement to the Pre-Surgical Instructions that were given to them at this PAT appointment. Patient was also educated of the need to review these PAT instructions again prior to his/her surgery.I reviewed the appropriate phone numbers to call if they have any and questions or concerns.

## 2023-03-03 ENCOUNTER — Other Ambulatory Visit: Payer: Self-pay

## 2023-03-03 ENCOUNTER — Encounter (HOSPITAL_COMMUNITY): Payer: Self-pay

## 2023-03-03 ENCOUNTER — Encounter (HOSPITAL_COMMUNITY)
Admission: RE | Admit: 2023-03-03 | Discharge: 2023-03-03 | Disposition: A | Discharge status: Home / Self Care | Payer: No Typology Code available for payment source | Source: Ambulatory Visit | Attending: General Surgery

## 2023-03-03 VITALS — BP 126/86 | HR 68 | Temp 97.9°F | Resp 16 | Ht 66.0 in | Wt 215.0 lb

## 2023-03-03 DIAGNOSIS — Z01812 Encounter for preprocedural laboratory examination: Secondary | ICD-10-CM | POA: Diagnosis not present

## 2023-03-03 DIAGNOSIS — Z01818 Encounter for other preprocedural examination: Secondary | ICD-10-CM | POA: Diagnosis present

## 2023-03-03 HISTORY — DX: Unspecified osteoarthritis, unspecified site: M19.90

## 2023-03-03 LAB — CBC
HCT: 49 % (ref 39.0–52.0)
Hemoglobin: 15.8 g/dL (ref 13.0–17.0)
MCH: 30.2 pg (ref 26.0–34.0)
MCHC: 32.2 g/dL (ref 30.0–36.0)
MCV: 93.5 fL (ref 80.0–100.0)
Platelets: 232 10*3/uL (ref 150–400)
RBC: 5.24 MIL/uL (ref 4.22–5.81)
RDW: 12.7 % (ref 11.5–15.5)
WBC: 10 10*3/uL (ref 4.0–10.5)
nRBC: 0 % (ref 0.0–0.2)

## 2023-03-03 LAB — BASIC METABOLIC PANEL
Anion gap: 10 (ref 5–15)
BUN: 14 mg/dL (ref 8–23)
CO2: 26 mmol/L (ref 22–32)
Calcium: 8.9 mg/dL (ref 8.9–10.3)
Chloride: 101 mmol/L (ref 98–111)
Creatinine, Ser: 0.75 mg/dL (ref 0.61–1.24)
GFR, Estimated: >60 mL/min (ref >60)
Glucose, Bld: 100 mg/dL — ABNORMAL HIGH (ref 70–99)
Potassium: 3.9 mmol/L (ref 3.5–5.1)
Sodium: 137 mmol/L (ref 135–145)

## 2023-03-11 NOTE — Anesthesia Preprocedure Evaluation (Signed)
 Anesthesia Evaluation  Patient identified by MRN, date of birth, ID band Patient awake    Reviewed: Allergy & Precautions, H&P , NPO status , Patient's Chart, lab work & pertinent test results  History of Anesthesia Complications Negative for: history of anesthetic complications  Airway Mallampati: II  TM Distance: >3 FB Neck ROM: Full    Dental  (+) Dental Advisory Given, Edentulous Upper,    Pulmonary Current Smoker and Patient abstained from smoking.   Pulmonary exam normal        Cardiovascular negative cardio ROS Normal cardiovascular exam     Neuro/Psych negative neurological ROS  negative psych ROS   GI/Hepatic negative GI ROS, Neg liver ROS,,,Hx of SBO   Endo/Other  negative endocrine ROS    Renal/GU negative Renal ROS  negative genitourinary   Musculoskeletal  (+) Arthritis ,    Abdominal   Peds negative pediatric ROS (+)  Hematology negative hematology ROS (+)   Anesthesia Other Findings   Reproductive/Obstetrics negative OB ROS                             Anesthesia Physical Anesthesia Plan  ASA: 3  Anesthesia Plan: General   Post-op Pain Management: Tylenol PO (pre-op)*   Induction: Intravenous, Rapid sequence and Cricoid pressure planned  PONV Risk Score and Plan: 4 or greater and Ondansetron, Dexamethasone and Treatment may vary due to age or medical condition  Airway Management Planned: Oral ETT  Additional Equipment: None  Intra-op Plan:   Post-operative Plan: Extubation in OR  Informed Consent: I have reviewed the patients History and Physical, chart, labs and discussed the procedure including the risks, benefits and alternatives for the proposed anesthesia with the patient or authorized representative who has indicated his/her understanding and acceptance.   Patient has DNR.  Discussed DNR with patient and Suspend DNR.   Dental advisory given  Plan  Discussed with: Anesthesiologist and CRNA  Anesthesia Plan Comments:        Anesthesia Quick Evaluation

## 2023-03-12 ENCOUNTER — Encounter (HOSPITAL_COMMUNITY): Payer: Self-pay | Admitting: General Surgery

## 2023-03-12 ENCOUNTER — Encounter (HOSPITAL_COMMUNITY)
Admission: RE | Disposition: A | Discharge status: Home / Self Care | Payer: Self-pay | Source: Home / Self Care | Attending: General Surgery

## 2023-03-12 ENCOUNTER — Inpatient Hospital Stay (HOSPITAL_COMMUNITY): Payer: Self-pay | Admitting: Anesthesiology

## 2023-03-12 ENCOUNTER — Inpatient Hospital Stay (HOSPITAL_COMMUNITY)
Admission: RE | Admit: 2023-03-12 | Discharge: 2023-03-14 | DRG: 331 | Disposition: A | Discharge status: Home / Self Care | Payer: No Typology Code available for payment source | Attending: General Surgery | Admitting: General Surgery

## 2023-03-12 ENCOUNTER — Other Ambulatory Visit: Payer: Self-pay

## 2023-03-12 DIAGNOSIS — K435 Parastomal hernia without obstruction or  gangrene: Secondary | ICD-10-CM

## 2023-03-12 DIAGNOSIS — Z933 Colostomy status: Principal | ICD-10-CM

## 2023-03-12 DIAGNOSIS — K660 Peritoneal adhesions (postprocedural) (postinfection): Secondary | ICD-10-CM | POA: Diagnosis present

## 2023-03-12 DIAGNOSIS — K432 Incisional hernia without obstruction or gangrene: Secondary | ICD-10-CM

## 2023-03-12 DIAGNOSIS — K5720 Diverticulitis of large intestine with perforation and abscess without bleeding: Secondary | ICD-10-CM | POA: Diagnosis present

## 2023-03-12 DIAGNOSIS — Z8249 Family history of ischemic heart disease and other diseases of the circulatory system: Secondary | ICD-10-CM

## 2023-03-12 DIAGNOSIS — Z833 Family history of diabetes mellitus: Secondary | ICD-10-CM

## 2023-03-12 DIAGNOSIS — Z433 Encounter for attention to colostomy: Principal | ICD-10-CM

## 2023-03-12 DIAGNOSIS — F17290 Nicotine dependence, other tobacco product, uncomplicated: Secondary | ICD-10-CM | POA: Diagnosis present

## 2023-03-12 HISTORY — PX: XI ROBOTIC ASSISTED COLOSTOMY TAKEDOWN: SHX6828

## 2023-03-12 SURGERY — CLOSURE, COLOSTOMY, ROBOT-ASSISTED
Anesthesia: General

## 2023-03-12 MED ORDER — DEXAMETHASONE SODIUM PHOSPHATE 4 MG/ML IJ SOLN: INTRAMUSCULAR | Status: DC | PRN

## 2023-03-12 MED ORDER — ENOXAPARIN SODIUM 40 MG/0.4ML IJ SOSY
40.0000 mg | PREFILLED_SYRINGE | INTRAMUSCULAR | Status: DC
  Administered 2023-03-13 – 2023-03-14 (×2): 40 mg via SUBCUTANEOUS
  Filled 2023-03-12 (×2): qty 0.4

## 2023-03-12 MED ORDER — ACETAMINOPHEN 500 MG PO TABS
1000.0000 mg | ORAL_TABLET | ORAL | Status: DC
  Filled 2023-03-12: qty 2

## 2023-03-12 MED ORDER — POLYETHYLENE GLYCOL 3350 17 GM/SCOOP PO POWD
1.0000 | Freq: Once | ORAL | Status: DC
Start: 2023-03-12 — End: 2023-03-12

## 2023-03-12 MED ORDER — ENSURE PRE-SURGERY PO LIQD: 296.0000 mL | Freq: Once | ORAL | Status: DC

## 2023-03-12 MED ORDER — SIMETHICONE 80 MG PO CHEW: 40.0000 mg | CHEWABLE_TABLET | Freq: Four times a day (QID) | ORAL | Status: DC | PRN

## 2023-03-12 MED ORDER — OXYCODONE HCL 5 MG PO TABS
5.0000 mg | ORAL_TABLET | ORAL | Status: DC | PRN
  Administered 2023-03-13: 5 mg via ORAL
  Filled 2023-03-12: qty 1

## 2023-03-12 MED ORDER — ACETAMINOPHEN 500 MG PO TABS
1000.0000 mg | ORAL_TABLET | Freq: Four times a day (QID) | ORAL | Status: DC
  Administered 2023-03-12 – 2023-03-14 (×6): 1000 mg via ORAL
  Filled 2023-03-12 (×8): qty 2

## 2023-03-12 MED ORDER — LACTATED RINGERS IV SOLN
INTRAVENOUS | Status: DC
Start: 2023-03-12 — End: 2023-03-12

## 2023-03-12 MED ORDER — EPHEDRINE SULFATE (PRESSORS) 50 MG/ML IJ SOLN
INTRAMUSCULAR | Status: DC | PRN
  Administered 2023-03-12 (×5): 5 mg via INTRAVENOUS

## 2023-03-12 MED ORDER — SUCCINYLCHOLINE CHLORIDE 200 MG/10ML IV SOSY
PREFILLED_SYRINGE | INTRAVENOUS | Status: AC
  Filled 2023-03-12: qty 10

## 2023-03-12 MED ORDER — BUPIVACAINE LIPOSOME 1.3 % IJ SUSP: 20.0000 mL | Freq: Once | INTRAMUSCULAR | Status: DC

## 2023-03-12 MED ORDER — BISACODYL 5 MG PO TBEC
20.0000 mg | DELAYED_RELEASE_TABLET | Freq: Once | ORAL | Status: DC
Start: 2023-03-12 — End: 2023-03-12

## 2023-03-12 MED ORDER — PROPOFOL 10 MG/ML IV BOLUS
INTRAVENOUS | Status: AC
  Filled 2023-03-12: qty 20

## 2023-03-12 MED ORDER — PHENYLEPHRINE 80 MCG/ML (10ML) SYRINGE FOR IV PUSH (FOR BLOOD PRESSURE SUPPORT)
PREFILLED_SYRINGE | INTRAVENOUS | Status: AC
  Filled 2023-03-12: qty 10

## 2023-03-12 MED ORDER — OXYCODONE HCL 5 MG/5ML PO SOLN: 5.0000 mg | Freq: Once | ORAL | Status: DC | PRN

## 2023-03-12 MED ORDER — BUPIVACAINE LIPOSOME 1.3 % IJ SUSP
INTRAMUSCULAR | Status: AC
  Filled 2023-03-12: qty 20

## 2023-03-12 MED ORDER — ALVIMOPAN 12 MG PO CAPS
12.0000 mg | ORAL_CAPSULE | Freq: Two times a day (BID) | ORAL | Status: DC
  Administered 2023-03-13 – 2023-03-14 (×3): 12 mg via ORAL
  Filled 2023-03-12 (×3): qty 1

## 2023-03-12 MED ORDER — SUGAMMADEX SODIUM 200 MG/2ML IV SOLN
INTRAVENOUS | Status: AC
  Filled 2023-03-12: qty 2

## 2023-03-12 MED ORDER — ONDANSETRON HCL 4 MG/2ML IJ SOLN: 4.0000 mg | Freq: Four times a day (QID) | INTRAMUSCULAR | Status: DC | PRN

## 2023-03-12 MED ORDER — ALVIMOPAN 12 MG PO CAPS
12.0000 mg | ORAL_CAPSULE | ORAL | Status: DC
  Filled 2023-03-12: qty 1

## 2023-03-12 MED ORDER — PROPOFOL 10 MG/ML IV BOLUS
INTRAVENOUS | Status: DC | PRN
  Administered 2023-03-12: 130 mg via INTRAVENOUS

## 2023-03-12 MED ORDER — RINGERS IRRIGATION IR SOLN
Status: DC | PRN
  Administered 2023-03-12: 1000 mL

## 2023-03-12 MED ORDER — CHLORHEXIDINE GLUCONATE 0.12 % MT SOLN: 15.0000 mL | Freq: Once | OROMUCOSAL | Status: DC

## 2023-03-12 MED ORDER — DROPERIDOL 2.5 MG/ML IJ SOLN: 0.6250 mg | Freq: Once | INTRAMUSCULAR | Status: DC | PRN

## 2023-03-12 MED ORDER — SACCHAROMYCES BOULARDII 250 MG PO CAPS
250.0000 mg | ORAL_CAPSULE | Freq: Two times a day (BID) | ORAL | Status: DC
  Administered 2023-03-12 – 2023-03-14 (×5): 250 mg via ORAL
  Filled 2023-03-12 (×5): qty 1

## 2023-03-12 MED ORDER — METHYLENE BLUE (ANTIDOTE) 1 % IV SOLN
INTRAVENOUS | Status: AC
  Filled 2023-03-12: qty 10

## 2023-03-12 MED ORDER — ONDANSETRON HCL 4 MG/2ML IJ SOLN
INTRAMUSCULAR | Status: DC | PRN
  Administered 2023-03-12: 4 mg via INTRAVENOUS

## 2023-03-12 MED ORDER — SODIUM CHLORIDE 0.9 % IV SOLN
2.0000 g | INTRAVENOUS | Status: AC
  Administered 2023-03-12: 2 g via INTRAVENOUS
  Filled 2023-03-12: qty 2

## 2023-03-12 MED ORDER — ROCURONIUM BROMIDE 100 MG/10ML IV SOLN
INTRAVENOUS | Status: DC | PRN
  Administered 2023-03-12: 70 mg via INTRAVENOUS
  Administered 2023-03-12: 20 mg via INTRAVENOUS

## 2023-03-12 MED ORDER — GABAPENTIN 300 MG PO CAPS
300.0000 mg | ORAL_CAPSULE | ORAL | Status: DC
  Filled 2023-03-12: qty 1

## 2023-03-12 MED ORDER — ALUM & MAG HYDROXIDE-SIMETH 200-200-20 MG/5ML PO SUSP: 30.0000 mL | Freq: Four times a day (QID) | ORAL | Status: DC | PRN

## 2023-03-12 MED ORDER — BUPIVACAINE LIPOSOME 1.3 % IJ SUSP
INTRAMUSCULAR | Status: DC | PRN
  Administered 2023-03-12: 20 mL

## 2023-03-12 MED ORDER — CALCIUM CARBONATE ANTACID 500 MG PO CHEW: 1.0000 | CHEWABLE_TABLET | Freq: Three times a day (TID) | ORAL | Status: DC | PRN

## 2023-03-12 MED ORDER — LIDOCAINE HCL (PF) 2 % IJ SOLN
INTRAMUSCULAR | Status: AC
  Filled 2023-03-12: qty 5

## 2023-03-12 MED ORDER — ROCURONIUM BROMIDE 10 MG/ML (PF) SYRINGE
PREFILLED_SYRINGE | INTRAVENOUS | Status: AC
  Filled 2023-03-12: qty 10

## 2023-03-12 MED ORDER — BUPIVACAINE-EPINEPHRINE (PF) 0.25% -1:200000 IJ SOLN
INTRAMUSCULAR | Status: AC
Start: 2023-03-12 — End: ?
  Filled 2023-03-12: qty 30

## 2023-03-12 MED ORDER — DEXMEDETOMIDINE HCL IN NACL 80 MCG/20ML IV SOLN
INTRAVENOUS | Status: DC | PRN
  Administered 2023-03-12 (×3): 4 ug via INTRAVENOUS
  Administered 2023-03-12: 8 ug via INTRAVENOUS

## 2023-03-12 MED ORDER — LIDOCAINE HCL (CARDIAC) PF 100 MG/5ML IV SOSY
PREFILLED_SYRINGE | INTRAVENOUS | Status: DC | PRN
  Administered 2023-03-12: 100 mg via INTRAVENOUS

## 2023-03-12 MED ORDER — FENTANYL CITRATE PF 50 MCG/ML IJ SOSY: 25.0000 ug | PREFILLED_SYRINGE | INTRAMUSCULAR | Status: DC | PRN

## 2023-03-12 MED ORDER — SUGAMMADEX SODIUM 200 MG/2ML IV SOLN
INTRAVENOUS | Status: DC | PRN
  Administered 2023-03-12: 200 mg via INTRAVENOUS

## 2023-03-12 MED ORDER — DEXAMETHASONE SODIUM PHOSPHATE 10 MG/ML IJ SOLN
INTRAMUSCULAR | Status: DC | PRN
Start: 2023-03-12 — End: 2023-03-12
  Administered 2023-03-12: 5 mg via INTRAVENOUS

## 2023-03-12 MED ORDER — BUPIVACAINE-EPINEPHRINE 0.25% -1:200000 IJ SOLN
INTRAMUSCULAR | Status: DC | PRN
  Administered 2023-03-12: 30 mL

## 2023-03-12 MED ORDER — FENTANYL CITRATE (PF) 100 MCG/2ML IJ SOLN
INTRAMUSCULAR | Status: DC | PRN
  Administered 2023-03-12: 50 ug via INTRAVENOUS
  Administered 2023-03-12 (×2): 25 ug via INTRAVENOUS

## 2023-03-12 MED ORDER — ONDANSETRON HCL 4 MG/2ML IJ SOLN
INTRAMUSCULAR | Status: AC
  Filled 2023-03-12: qty 2

## 2023-03-12 MED ORDER — HYDROMORPHONE HCL 1 MG/ML IJ SOLN: 0.5000 mg | INTRAMUSCULAR | Status: DC | PRN

## 2023-03-12 MED ORDER — OXYCODONE HCL 5 MG PO TABS: 5.0000 mg | ORAL_TABLET | Freq: Once | ORAL | Status: DC | PRN

## 2023-03-12 MED ORDER — ORAL CARE MOUTH RINSE: 15.0000 mL | Freq: Once | OROMUCOSAL | Status: DC

## 2023-03-12 MED ORDER — DIPHENHYDRAMINE HCL 12.5 MG/5ML PO ELIX: 12.5000 mg | ORAL_SOLUTION | Freq: Four times a day (QID) | ORAL | Status: DC | PRN

## 2023-03-12 MED ORDER — PHENYLEPHRINE 80 MCG/ML (10ML) SYRINGE FOR IV PUSH (FOR BLOOD PRESSURE SUPPORT)
PREFILLED_SYRINGE | INTRAVENOUS | Status: DC | PRN
  Administered 2023-03-12: 80 ug via INTRAVENOUS

## 2023-03-12 MED ORDER — SODIUM CHLORIDE 0.9 % IV SOLN
2.0000 g | Freq: Two times a day (BID) | INTRAVENOUS | Status: AC
  Administered 2023-03-12: 2 g via INTRAVENOUS
  Filled 2023-03-12: qty 2

## 2023-03-12 MED ORDER — HEPARIN SODIUM (PORCINE) 5000 UNIT/ML IJ SOLN
5000.0000 [IU] | Freq: Once | INTRAMUSCULAR | Status: DC
  Filled 2023-03-12: qty 1

## 2023-03-12 MED ORDER — ENSURE SURGERY PO LIQD
237.0000 mL | Freq: Two times a day (BID) | ORAL | Status: DC
  Administered 2023-03-13 (×2): 237 mL via ORAL

## 2023-03-12 MED ORDER — ONDANSETRON HCL 4 MG PO TABS
4.0000 mg | ORAL_TABLET | Freq: Four times a day (QID) | ORAL | Status: DC | PRN
Start: 2023-03-12 — End: 2023-03-14

## 2023-03-12 MED ORDER — ACETAMINOPHEN 10 MG/ML IV SOLN: 1000.0000 mg | Freq: Once | INTRAVENOUS | Status: DC | PRN

## 2023-03-12 MED ORDER — KCL IN DEXTROSE-NACL 20-5-0.45 MEQ/L-%-% IV SOLN
INTRAVENOUS | Status: DC
  Filled 2023-03-12: qty 1000

## 2023-03-12 MED ORDER — DEXMEDETOMIDINE HCL IN NACL 80 MCG/20ML IV SOLN
INTRAVENOUS | Status: AC
  Filled 2023-03-12: qty 20

## 2023-03-12 MED ORDER — DEXAMETHASONE SODIUM PHOSPHATE 10 MG/ML IJ SOLN
INTRAMUSCULAR | Status: AC
  Filled 2023-03-12: qty 1

## 2023-03-12 MED ORDER — ENSURE PRE-SURGERY PO LIQD
592.0000 mL | Freq: Once | ORAL | Status: DC
Start: 2023-03-12 — End: 2023-03-12

## 2023-03-12 MED ORDER — 0.9 % SODIUM CHLORIDE (POUR BTL) OPTIME
TOPICAL | Status: DC | PRN
  Administered 2023-03-12: 1000 mL

## 2023-03-12 MED ORDER — GABAPENTIN 300 MG PO CAPS
300.0000 mg | ORAL_CAPSULE | Freq: Two times a day (BID) | ORAL | Status: DC
  Administered 2023-03-12 – 2023-03-14 (×5): 300 mg via ORAL
  Filled 2023-03-12 (×5): qty 1

## 2023-03-12 MED ORDER — FENTANYL CITRATE (PF) 100 MCG/2ML IJ SOLN
INTRAMUSCULAR | Status: AC
  Filled 2023-03-12: qty 2

## 2023-03-12 MED ORDER — DIPHENHYDRAMINE HCL 50 MG/ML IJ SOLN: 12.5000 mg | Freq: Four times a day (QID) | INTRAMUSCULAR | Status: DC | PRN

## 2023-03-12 MED ORDER — MIDAZOLAM HCL 2 MG/2ML IJ SOLN
INTRAMUSCULAR | Status: AC
  Filled 2023-03-12: qty 2

## 2023-03-12 SURGICAL SUPPLY — 85 items
BAG COUNTER SPONGE SURGICOUNT (BAG) ×1 IMPLANT
BINDER ABDOMINAL 12 ML 46-62 (SOFTGOODS) IMPLANT
BLADE EXTENDED COATED 6.5IN (ELECTRODE) IMPLANT
CANNULA REDUCER 12-8 DVNC XI (CANNULA) IMPLANT
CHLORAPREP W/TINT 26 (MISCELLANEOUS) ×1 IMPLANT
COVER SURGICAL LIGHT HANDLE (MISCELLANEOUS) ×2 IMPLANT
COVER TIP SHEARS 8 DVNC (MISCELLANEOUS) ×1 IMPLANT
DERMABOND ADVANCED .7 DNX12 (GAUZE/BANDAGES/DRESSINGS) IMPLANT
DRAIN CHANNEL 19F RND (DRAIN) IMPLANT
DRAPE ARM DVNC X/XI (DISPOSABLE) ×4 IMPLANT
DRAPE COLUMN DVNC XI (DISPOSABLE) ×1 IMPLANT
DRAPE LAPAROSCOPIC ABDOMINAL (DRAPES) ×1 IMPLANT
DRAPE SURG IRRIG POUCH 19X23 (DRAPES) ×1 IMPLANT
DRAPE UTILITY XL STRL (DRAPES) ×1 IMPLANT
DRIVER NDL LRG 8 DVNC XI (INSTRUMENTS) ×1 IMPLANT
DRIVER NDLE LRG 8 DVNC XI (INSTRUMENTS) ×1 IMPLANT
DRSG MEPILEX POST OP 4X8 (GAUZE/BANDAGES/DRESSINGS) IMPLANT
DRSG OPSITE POSTOP 4X6 (GAUZE/BANDAGES/DRESSINGS) IMPLANT
DRSG OPSITE POSTOP 4X8 (GAUZE/BANDAGES/DRESSINGS) IMPLANT
DRSG TELFA 4X10 ISLAND STR (GAUZE/BANDAGES/DRESSINGS) IMPLANT
ELECT PENCIL ROCKER SW 15FT (MISCELLANEOUS) ×1 IMPLANT
ELECT REM PT RETURN 15FT ADLT (MISCELLANEOUS) ×1 IMPLANT
EVACUATOR SILICONE 100CC (DRAIN) IMPLANT
GLOVE BIO SURGEON STRL SZ 6.5 (GLOVE) ×3 IMPLANT
GLOVE INDICATOR 6.5 STRL GRN (GLOVE) ×3 IMPLANT
GOWN SRG XL LVL 4 BRTHBL STRL (GOWNS) ×1 IMPLANT
GOWN STRL REUS W/ TWL XL LVL3 (GOWN DISPOSABLE) ×3 IMPLANT
GRASPER SUT TROCAR 14GX15 (MISCELLANEOUS) IMPLANT
GRASPER TIP-UP FEN DVNC XI (INSTRUMENTS) ×1 IMPLANT
HOLDER FOLEY CATH W/STRAP (MISCELLANEOUS) ×1 IMPLANT
IRRIG SUCT STRYKERFLOW 2 WTIP (MISCELLANEOUS) ×1 IMPLANT
IRRIGATION SUCT STRKRFLW 2 WTP (MISCELLANEOUS) ×1 IMPLANT
KIT BASIN OR (CUSTOM PROCEDURE TRAY) ×1 IMPLANT
KIT PROCEDURE DVNC SI (MISCELLANEOUS) IMPLANT
KIT TURNOVER KIT A (KITS) IMPLANT
NDL HYPO 22X1.5 SAFETY MO (MISCELLANEOUS) ×1 IMPLANT
NDL INSUFFLATION 14GA 120MM (NEEDLE) ×1 IMPLANT
NEEDLE HYPO 22X1.5 SAFETY MO (MISCELLANEOUS) ×1 IMPLANT
NEEDLE INSUFFLATION 14GA 120MM (NEEDLE) ×1 IMPLANT
PACK CARDIOVASCULAR III (CUSTOM PROCEDURE TRAY) ×1 IMPLANT
PACK COLON (CUSTOM PROCEDURE TRAY) ×1 IMPLANT
PACK GENERAL/GYN (CUSTOM PROCEDURE TRAY) ×1 IMPLANT
PAD POSITIONING PINK XL (MISCELLANEOUS) ×1 IMPLANT
RELOAD STAPLE 60 3.5 BLU DVNC (STAPLE) IMPLANT
RELOAD STAPLE 60 4.3 GRN DVNC (STAPLE) IMPLANT
RETRACTOR WND ALEXIS 18 MED (MISCELLANEOUS) IMPLANT
RTRCTR WOUND ALEXIS 18CM MED (MISCELLANEOUS) IMPLANT
SCISSORS LAP 5X35 DISP (ENDOMECHANICALS) IMPLANT
SCISSORS MNPLR CVD DVNC XI (INSTRUMENTS) ×1 IMPLANT
SEAL UNIV 5-12 XI (MISCELLANEOUS) ×3 IMPLANT
SEALER VESSEL EXT DVNC XI (MISCELLANEOUS) ×1 IMPLANT
SOL ELECTROSURG ANTI STICK (MISCELLANEOUS) ×1 IMPLANT
SOLUTION ELECTROSURG ANTI STCK (MISCELLANEOUS) ×1 IMPLANT
SPIKE FLUID TRANSFER (MISCELLANEOUS) IMPLANT
STAPLER 60 SUREFORM DVNC (STAPLE) IMPLANT
STAPLER ECHELON POWER CIR 29 (STAPLE) IMPLANT
STAPLER ECHELON POWER CIR 31 (STAPLE) IMPLANT
STAPLER RELOAD 3.5X60 BLU DVNC (STAPLE) IMPLANT
STAPLER RELOAD 4.3X60 GRN DVNC (STAPLE) IMPLANT
STAPLER SKIN PROX WIDE 3.9 (STAPLE) IMPLANT
SUT ETHILON 2 0 PS N (SUTURE) IMPLANT
SUT NOVA NAB DX-16 0-1 5-0 T12 (SUTURE) IMPLANT
SUT NOVA NAB GS-21 1 T12 (SUTURE) ×2 IMPLANT
SUT PROLENE 2 0 KS (SUTURE) IMPLANT
SUT SILK 2 0 SH CR/8 (SUTURE) IMPLANT
SUT SILK 2-0 18XBRD TIE 12 (SUTURE) ×1 IMPLANT
SUT SILK 3 0 SH CR/8 (SUTURE) ×1 IMPLANT
SUT SILK 3-0 18XBRD TIE 12 (SUTURE) IMPLANT
SUT V-LOC BARB 180 2/0GR6 GS22 (SUTURE) IMPLANT
SUT VIC AB 2-0 SH 18 (SUTURE) ×1 IMPLANT
SUT VIC AB 2-0 SH 27X BRD (SUTURE) IMPLANT
SUT VIC AB 3-0 SH 18 (SUTURE) IMPLANT
SUT VIC AB 4-0 PS2 18 (SUTURE) ×1 IMPLANT
SUT VIC AB 4-0 PS2 27 (SUTURE) ×2 IMPLANT
SUT VICRYL 0 UR6 27IN ABS (SUTURE) ×1 IMPLANT
SUTURE V-LC BRB 180 2/0GR6GS22 (SUTURE) IMPLANT
SYR 20ML ECCENTRIC (SYRINGE) ×1 IMPLANT
SYR 20ML LL LF (SYRINGE) ×1 IMPLANT
SYS WOUND ALEXIS 18CM MED (MISCELLANEOUS) IMPLANT
SYSTEM WOUND ALEXIS 18CM MED (MISCELLANEOUS) IMPLANT
TOWEL OR 17X26 10 PK STRL BLUE (TOWEL DISPOSABLE) ×1 IMPLANT
TRAY FOLEY MTR SLVR 16FR STAT (SET/KITS/TRAYS/PACK) ×1 IMPLANT
TROCAR ADV FIXATION 5X100MM (TROCAR) ×1 IMPLANT
TUBING CONNECTING 10 (TUBING) ×2 IMPLANT
TUBING INSUFFLATION 10FT LAP (TUBING) ×1 IMPLANT

## 2023-03-12 NOTE — Interval H&P Note (Signed)
 History and Physical Interval Note:  03/12/2023 7:15 AM  Roger Mcdonald  has presented today for surgery, with the diagnosis of COLOSTOMY PRESENT.  The various methods of treatment have been discussed with the patient and family. After consideration of risks, benefits and other options for treatment, the patient has consented to  Procedure(s): XI ROBOTIC ASSISTED COLOSTOMY REVERSAL (N/A) POSSIBLE HERNIA REPAIR INCISIONAL (N/A) as a surgical intervention.  The patient's history has been reviewed, patient examined, no change in status, stable for surgery.  I have reviewed the patient's chart and labs.  Questions were answered to the patient's satisfaction.     Vanita Panda, MD  Colorectal and General Surgery [facility] Surgery

## 2023-03-12 NOTE — Plan of Care (Signed)
   Problem: Education: Goal: Understanding of discharge needs will improve Outcome: Progressing   Problem: Education: Goal: Knowledge of General Education information will improve Description: Including pain rating scale, medication(s)/side effects and non-pharmacologic comfort measures Outcome: Progressing

## 2023-03-12 NOTE — Transfer of Care (Signed)
 Immediate Anesthesia Transfer of Care Note  Patient: Roger Mcdonald  Procedure(s) Performed: XI ROBOTIC ASSISTED COLOSTOMY REVERSAL  Patient Location: PACU  Anesthesia Type:General  Level of Consciousness: drowsy and patient cooperative  Airway & Oxygen Therapy: Patient Spontanous Breathing and Patient connected to face mask oxygen  Post-op Assessment: Report given to RN and Post -op Vital signs reviewed and stable  Post vital signs: Reviewed and stable  Last Vitals:  Vitals Value Taken Time  BP 149/87 03/12/23 1015  Temp 35.8 C 03/12/23 1015  Pulse 70 03/12/23 1021  Resp 18 03/12/23 1021  SpO2 99 % 03/12/23 1021  Vitals shown include unfiled device data.  Last Pain:  Vitals:   03/12/23 1015  TempSrc:   PainSc: Asleep         Complications: No notable events documented.

## 2023-03-12 NOTE — Anesthesia Postprocedure Evaluation (Signed)
 Anesthesia Post Note  Patient: Roger Mcdonald  Procedure(s) Performed: XI ROBOTIC ASSISTED COLOSTOMY REVERSAL     Patient location during evaluation: PACU Anesthesia Type: General Level of consciousness: awake and alert Pain management: pain level controlled Vital Signs Assessment: post-procedure vital signs reviewed and stable Respiratory status: spontaneous breathing, nonlabored ventilation, respiratory function stable and patient connected to nasal cannula oxygen Cardiovascular status: blood pressure returned to baseline and stable Postop Assessment: no apparent nausea or vomiting Anesthetic complications: no   No notable events documented.  Last Vitals:  Vitals:   03/12/23 1100 03/12/23 1115  BP: 105/64 117/70  Pulse: (!) 55 63  Resp: (!) 21 20  Temp:    SpO2: 94% 96%    Last Pain:  Vitals:   03/12/23 1115  TempSrc:   PainSc: 0-No pain                 [place]

## 2023-03-12 NOTE — Anesthesia Procedure Notes (Signed)
 Procedure Name: Intubation Date/Time: 03/12/2023 7:43 AM  Performed by: Theodosia Quay, CRNAPre-anesthesia Checklist: Patient identified, Emergency Drugs available, Suction available, Patient being monitored and Timeout performed Patient Re-evaluated:Patient Re-evaluated prior to induction Oxygen Delivery Method: Circle system utilized Preoxygenation: Pre-oxygenation with 100% oxygen Induction Type: IV induction Ventilation: Mask ventilation without difficulty Laryngoscope Size: Mac and 4 Grade View: Grade I Tube type: Oral Tube size: 7.5 mm Number of attempts: 1 Airway Equipment and Method: Stylet Placement Confirmation: ETT inserted through vocal cords under direct vision, positive ETCO2, CO2 detector and breath sounds checked- equal and bilateral Secured at: 22 cm Tube secured with: Tape Dental Injury: Teeth and Oropharynx as per pre-operative assessment

## 2023-03-12 NOTE — Op Note (Signed)
 03/12/2023  9:55 AM  PATIENT:  Roger Mcdonald  73 y.o. male  Patient Care Team: Raquel James, MD as PCP - General (Family Medicine)  PRE-OPERATIVE DIAGNOSIS:  COLOSTOMY PRESENT  POST-OPERATIVE DIAGNOSIS:  COLOSTOMY PRESENT  PROCEDURE: XI ROBOTIC ASSISTED COLOSTOMY REVERSAL    Surgeon(s): Romie Levee, MD Andria Meuse, MD  ASSISTANT: Dr Cliffton Asters   ANESTHESIA:   local and general  EBL:80ml  Total I/O In: 1100 [I.V.:1000; IV Piggyback:100] Out: 100 [Urine:50; Blood:50]  Delay start of Pharmacological VTE agent (>24hrs) due to surgical blood loss or risk of bleeding:  no  DRAINS: none   SPECIMEN:  Source of Specimen:  colostomy  DISPOSITION OF SPECIMEN:  PATHOLOGY  COUNTS:  YES  PLAN OF CARE: Admit to inpatient   PATIENT DISPOSITION:  PACU - hemodynamically stable.  INDICATION:    73 y.o. 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  I recommended minimally invasive colostomy reversal:  The anatomy & physiology of the digestive tract was discussed.  The pathophysiology was discussed.  Natural history risks without surgery was discussed.   I worked to give an overview of the disease and the frequent need to have multispecialty involvement.  I feel the risks of no intervention will lead to serious problems that outweigh the operative risks; therefore, I recommended a partial colectomy to remove the pathology.  Laparoscopic & open techniques were discussed.   Risks such as bleeding, infection, abscess, leak, reoperation, possible ostomy, hernia, heart attack, death, and other risks were discussed.  I noted a good likelihood this will help address the problem.   Goals of post-operative recovery were discussed as well.    The patient expressed understanding & wished to proceed with surgery.  OR FINDINGS:   Patient had minimal pelvic adhesions, moderate omental adhesions to the abdominal wall  Wide based incisional hernia, parastomal hernia containing  omentum  The anastomosis rests ~13 cm from the anal verge by rigid proctoscopy.  DESCRIPTION:   Informed consent was confirmed.  The patient underwent general anaesthesia without difficulty.  The patient was positioned appropriately.  VTE prevention in place.  The patient's abdomen was clipped, prepped, & draped in a sterile fashion.  Surgical timeout confirmed our plan.  The patient was positioned in reverse Trendelenburg.  Abdominal entry was gained using a Varies needle in the LUQ.  Entry was clean.  I induced carbon dioxide insufflation.  An 8mm robotic port was placed in the RUQ.  Camera inspection revealed no injury.  Extra ports were carefully placed under direct laparoscopic visualization.  I laparoscopically reflected the greater omentum and the upper abdomen the small bowel in the upper abdomen. The patient was appropriately positioned and the robot was docked to the patient's left side.  Instruments were placed under direct visualization.    I mobilized the omentum bluntly and with the vessel sealer off the anterior midline.  One small bowel adhesion was also taken.  This was then placed into the LUQ. The colon was mobilized off the fascial edge of the ostomy opening using the vessel sealer. I then turned my attention to the pelvis.  It appeared that the previous transection was done at the rectosigmoid junction.  I decided to just mobilize the rectal stump.  I scored the base of peritoneum of the right side of the mesentery of the left colon from the ligament of Treitz to the peritoneal reflection of the mid rectum.  I elevated the sigmoid mesentery and enetered into the retro-mesenteric  plane. We were able to identify the left ureter and gonadal vessels. We kept those posterior within the retroperitoneum and elevated the left colon mesentery off that. This allowed me to help mobilize the rectum as well by freeing the mesorectum off the sacrum.  I mobilized the peritoneal coverings towards the  peritoneal reflection on both the right and left sides of the rectum.  I could see the right and left ureters and stayed away from them.   At this point the robot was undocked.  I used rectal dilators sequentially under laparoscopic visualization.  I was able to get the 28 mm EEA dilator to the end of the rectal stump.  After this, I mobilized the colostomy using electrocautery and blunt dissection.  Once the colostomy was completely free from the fascial edges, I divided the mesentery just distal to the colostomy using electrocautery and 3-0 silk suture ties.  Once this was completed pursestring was placed over the colon and a 2-0 Prolene pursestring suture was placed.  This was secured with 3 oh silks and a 29 mm EEA anvil was placed into the colon and pursestring was tied tightly around this.  The fat around the plate of the EEA was cleared and this was then placed back into the pelvis.  An anastomosis was created using the EEA stapler through the rectal stump under laparoscopic visualization.  There was no tension noted on the anastomosis.  There was no leak when tested with irrigation under insufflation.  There was an area anteriorly that appeared thin and I decided to oversew this area.  The robot was read docked.  A 3-0 V-Loc suture was used to oversew the anterior portion of the anastomosis.  Once this was complete, the abdomen was inspected and there was no sign of active bleeding or injury.  The abdomen was irrigated and then desufflated.  The colostomy fascia was closed transversely using 2 running #1 PDS sutures.  The subcutaneous tissue was partially closed over this using a 2-0 Vicryl pursestring suture.  The dermal layer was partially closed as well using a 2-0 Vicryl pursestring suture.  A Telfa sponge was placed in the middle of the wound for drainage.  A dressing was applied.  The remaining port sites were closed using interrupted 4-0 Vicryl suture and Dermabond.  The patient was then awakened  from anesthesia and sent to the postanesthesia care unit in stable condition.  All counts were correct per operating room staff.  An MD assistant was necessary for tissue manipulation, retraction and positioning due to the complexity of the case and hospital policies   Vanita Panda, MD  Colorectal and General Surgery [facility]

## 2023-03-13 ENCOUNTER — Encounter (HOSPITAL_COMMUNITY): Payer: Self-pay | Admitting: General Surgery

## 2023-03-13 LAB — BASIC METABOLIC PANEL
Anion gap: 8 (ref 5–15)
BUN: 9 mg/dL (ref 8–23)
CO2: 24 mmol/L (ref 22–32)
Calcium: 8.5 mg/dL — ABNORMAL LOW (ref 8.9–10.3)
Chloride: 101 mmol/L (ref 98–111)
Creatinine, Ser: 0.78 mg/dL (ref 0.61–1.24)
GFR, Estimated: >60 mL/min (ref >60)
Glucose, Bld: 119 mg/dL — ABNORMAL HIGH (ref 70–99)
Potassium: 4.1 mmol/L (ref 3.5–5.1)
Sodium: 133 mmol/L — ABNORMAL LOW (ref 135–145)

## 2023-03-13 LAB — CBC
HCT: 45.5 % (ref 39.0–52.0)
Hemoglobin: 14.9 g/dL (ref 13.0–17.0)
MCH: 29.6 pg (ref 26.0–34.0)
MCHC: 32.7 g/dL (ref 30.0–36.0)
MCV: 90.5 fL (ref 80.0–100.0)
Platelets: 223 10*3/uL (ref 150–400)
RBC: 5.03 MIL/uL (ref 4.22–5.81)
RDW: 12.6 % (ref 11.5–15.5)
WBC: 11.6 10*3/uL — ABNORMAL HIGH (ref 4.0–10.5)
nRBC: 0 % (ref 0.0–0.2)

## 2023-03-13 LAB — SURGICAL PATHOLOGY

## 2023-03-13 NOTE — Plan of Care (Signed)
   Problem: Nutrition: Goal: Adequate nutrition will be maintained Outcome: Progressing   Problem: Coping: Goal: Level of anxiety will decrease Outcome: Progressing

## 2023-03-13 NOTE — Progress Notes (Signed)
   03/13/23 0840  TOC Brief Assessment  Insurance and Status Reviewed  Patient has primary care physician Yes  Home environment has been reviewed resides in private residence  Prior level of function: Independent  Prior/Current Home Services No current home services  Social Drivers of Health Review SDOH reviewed no interventions necessary  Readmission risk has been reviewed Yes  Transition of care needs no transition of care needs at this time

## 2023-03-13 NOTE — Progress Notes (Signed)
 1 Day Post-Op Robotic colostomy reversal Subjective: Doing well, ambulating, tolerating liquids, having some L sided abd pain  Objective: Vital signs in last 24 hours: Temp:  [96.5 F (35.8 C)-99 F (37.2 C)] 98.1 F (36.7 C) (02/28 0510) Pulse Rate:  [55-94] 82 (02/28 0510) Resp:  [15-21] 15 (02/28 0510) BP: (105-149)/(61-87) 119/71 (02/28 0510) SpO2:  [93 %-100 %] 97 % (02/28 0510) Weight:  [94.5 kg] 94.5 kg (02/28 0500)   Intake/Output from previous day: 02/27 0701 - 02/28 0700 In: 4650.8 [P.O.:2660; I.V.:1790.8; IV Piggyback:200] Out: 4225 [Urine:4175; Blood:50] Intake/Output this shift: No intake/output data recorded.   General appearance: alert and cooperative GI: soft, non-distended  Incision: no significant drainage  Lab Results:  Recent Labs    03/13/23 0425  WBC 11.6*  HGB 14.9  HCT 45.5  PLT 223   BMET Recent Labs    03/13/23 0700  NA 133*  K 4.1  CL 101  CO2 24  GLUCOSE 119*  BUN 9  CREATININE 0.78  CALCIUM 8.5*   PT/INR No results for input(s): "LABPROT", "INR" in the last 72 hours. ABG No results for input(s): "PHART", "HCO3" in the last 72 hours.  Invalid input(s): "PCO2", "PO2"  MEDS, Scheduled  acetaminophen  1,000 mg Oral Q6H   alvimopan  12 mg Oral BID   enoxaparin (LOVENOX) injection  40 mg Subcutaneous Q24H   feeding supplement  237 mL Oral BID BM   gabapentin  300 mg Oral BID   saccharomyces boulardii  250 mg Oral BID    Studies/Results: No results found.  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

## 2023-03-13 NOTE — Progress Notes (Addendum)
 Mobility Specialist - Progress Note   03/13/23 0925  Mobility  Activity Ambulated independently in hallway  Level of Assistance Independent  Assistive Device None  Distance Ambulated (ft) 300 ft  Activity Response Tolerated well  Mobility Referral Yes  Mobility visit 1 Mobility  Mobility Specialist Start Time (ACUTE ONLY) 0914  Mobility Specialist Stop Time (ACUTE ONLY) 0925  Mobility Specialist Time Calculation (min) (ACUTE ONLY) 11 min   Pt received in bed and agreeable to mobility. No complaints during session. Pt to recliner after session with all needs met.    [facility]

## 2023-03-14 LAB — CBC
HCT: 46.8 % (ref 39.0–52.0)
Hemoglobin: 14.9 g/dL (ref 13.0–17.0)
MCH: 29.5 pg (ref 26.0–34.0)
MCHC: 31.8 g/dL (ref 30.0–36.0)
MCV: 92.7 fL (ref 80.0–100.0)
Platelets: 228 10*3/uL (ref 150–400)
RBC: 5.05 MIL/uL (ref 4.22–5.81)
RDW: 12.5 % (ref 11.5–15.5)
WBC: 13.2 10*3/uL — ABNORMAL HIGH (ref 4.0–10.5)
nRBC: 0 % (ref 0.0–0.2)

## 2023-03-14 LAB — BASIC METABOLIC PANEL
Anion gap: 8 (ref 5–15)
BUN: 11 mg/dL (ref 8–23)
CO2: 26 mmol/L (ref 22–32)
Calcium: 8.6 mg/dL — ABNORMAL LOW (ref 8.9–10.3)
Chloride: 100 mmol/L (ref 98–111)
Creatinine, Ser: 0.92 mg/dL (ref 0.61–1.24)
GFR, Estimated: >60 mL/min (ref >60)
Glucose, Bld: 103 mg/dL — ABNORMAL HIGH (ref 70–99)
Potassium: 4.2 mmol/L (ref 3.5–5.1)
Sodium: 134 mmol/L — ABNORMAL LOW (ref 135–145)

## 2023-03-14 MED ORDER — OXYCODONE HCL 5 MG PO TABS
5.0000 mg | ORAL_TABLET | Freq: Four times a day (QID) | ORAL | 0 refills | Status: AC | PRN
Start: 2023-03-14 — End: ?

## 2023-03-14 NOTE — Discharge Summary (Signed)
 Physician Discharge Summary  Patient ID: Roger Mcdonald MRN: [id number] DOB/AGE: 1950/05/07 73 y.o.  Admit date: 03/12/2023 Discharge date: 03/14/2023  Admission Diagnoses: Colostomy in place Discharge Diagnoses:  Principal Problem:   Colostomy in place [facility])   Discharged Condition: good  Hospital Course: Patient was admitted to the med surg floor after surgery.  Diet was advanced as tolerated.  Patient began to have bowel function on postop day 1.  By postop day 2, he was tolerating a solid diet and pain was controlled with oral medications.  He was urinating without difficulty and ambulating without assistance.  Patient was felt to be in stable condition for discharge to home.   Consults: None  Significant Diagnostic Studies: labs: cbc, bmet  Treatments: IV hydration, analgesia: acetaminophen and Oxycodone, and surgery: Robotic colostomy reversal  Discharge Exam: Blood pressure 128/85, pulse 97, temperature 98.6 F (37 C), temperature source Oral, resp. rate 18, height 5\' 6"  (1.676 m), weight 97.1 kg, SpO2 96%. General appearance: alert and cooperative GI: soft, non-distended Incision/Wound: clean  Disposition: home   Allergies as of 03/14/2023   No Known Allergies      Medication List     TAKE these medications    aspirin EC 325 MG tablet Take 325-650 mg by mouth every 8 (eight) hours as needed (pain.).   calcium carbonate 750 MG chewable tablet Commonly known as: TUMS EX Chew 1-2 tablets by mouth 3 (three) times daily as needed for heartburn.   oxyCODONE 5 MG immediate release tablet Commonly known as: Oxy IR/ROXICODONE Take 1-2 tablets (5-10 mg total) by mouth every 6 (six) hours as needed (pain).        Follow-up Information     Romie Levee, MD. Schedule an appointment as soon as possible for a visit in 2 week(s).   Specialties: General Surgery, Colon and Rectal Surgery Contact information: [address] [place] [address] [place]  [address] [phone]                 Signed: Vanita Panda 03/14/2023, 10:05 AM

## 2023-03-14 NOTE — Progress Notes (Signed)
 Mobility Specialist - Progress Note   03/14/23 0920  Mobility  Activity Ambulated independently in hallway  Level of Assistance Independent  Assistive Device None  Distance Ambulated (ft) 500 ft  Activity Response Tolerated well  Mobility Referral Yes  Mobility visit 1 Mobility  Mobility Specialist Start Time (ACUTE ONLY) 0910  Mobility Specialist Stop Time (ACUTE ONLY) 0917  Mobility Specialist Time Calculation (min) (ACUTE ONLY) 7 min   Pt received in recliner and agreeable to mobility. No complaints during session. Pt to bathroom after session with all needs met.    [facility]

## 2023-03-14 NOTE — Plan of Care (Signed)

## 2023-03-14 NOTE — Progress Notes (Signed)
 Reviewed written d/c instructions w pt and all questions answered. Reviewed wound care w pt as well and a wk supply of guaze/tape given to pt. He verbalized understanding of above. D/C via w/c w all belongings in stable condtion.

## 2023-03-14 NOTE — Discharge Instructions (Signed)
 ABDOMINAL SURGERY: POST OP INSTRUCTIONS  DIET: Follow a light bland diet the first 24 hours after arrival home, such as soup, liquids, crackers, etc.  Be sure to include lots of fluids daily.  Avoid fast food or heavy meals as your are more likely to get nauseated.  Do not eat any uncooked fruits or vegetables for the next 2 weeks as your colon heals. Take your usually prescribed home medications unless otherwise directed. PAIN CONTROL: Pain is best controlled by a usual combination of three different methods TOGETHER: Ice/Heat Over the counter pain medication Prescription pain medication Most patients will experience some swelling and bruising around the incisions.  Ice packs or heating pads (30-60 minutes up to 6 times a day) will help. Use ice for the first few days to help decrease swelling and bruising, then switch to heat to help relax tight/sore spots and speed recovery.  Some people prefer to use ice alone, heat alone, alternating between ice & heat.  Experiment to what works for you.  Swelling and bruising can take several weeks to resolve.   It is helpful to take an over-the-counter pain medication regularly for the first few weeks.  Choose one of the following that works best for you: Naproxen (Aleve, etc)  Two 220mg  tabs twice a day Ibuprofen (Advil, etc) Three 200mg  tabs four times a day (every meal & bedtime) Acetaminophen (Tylenol, etc) 500-650mg  four times a day (every meal & bedtime) A  prescription for pain medication (such as oxycodone, hydrocodone, etc) should be given to you upon discharge.  Take your pain medication as prescribed.  If you are having problems/concerns with the prescription medicine (does not control pain, nausea, vomiting, rash, itching, etc), please call us [phone] to see if we need to switch you to a different pain medicine that will work better for you and/or control your side effect better. If you need a refill on your pain medication, please contact  your pharmacy.  They will contact our office to request authorization. Prescriptions will not be filled after 5 pm or on week-ends. Avoid getting constipated.  Between the surgery and the pain medications, it is common to experience some constipation.  Increasing fluid intake and taking a fiber supplement (such as Metamucil, Citrucel, FiberCon, MiraLax, etc) 1-2 times a day regularly will usually help prevent this problem from occurring.  A mild laxative (prune juice, Milk of Magnesia, MiraLax, etc) should be taken according to package directions if there are no bowel movements after 48 hours.   Watch out for diarrhea.  If you have many loose bowel movements, simplify your diet to bland foods & liquids for a few days.  Stop any stool softeners and decrease your fiber supplement.  Switching to mild anti-diarrheal medications (Kayopectate, Pepto Bismol) can help.  If this worsens or does not improve, please call us. Wash / shower every day.  You may shower over the incision / wound.  Avoid baths until the skin is fully healed.   Remove your bandages everyday and wash wound.  Replace dressing/Band-Aid to cover the incision after it is cleaned. ACTIVITIES as tolerated:   You may resume regular (light) daily activities beginning the next day--such as daily self-care, walking, climbing stairs--gradually increasing activities as tolerated.  If you can walk 30 minutes without difficulty, it is safe to try more intense activity such as jogging, treadmill, bicycling, low-impact aerobics, swimming, etc. Save the most intensive and strenuous activity for last such as sit-ups, heavy lifting, contact sports, etc  Refrain from any heavy lifting or straining until you are off narcotics for pain control.   DO NOT PUSH THROUGH PAIN.  Let pain be your guide: If it hurts to do something, don't do it.  Pain is your body warning you to avoid that activity for another week until the pain goes down. You may drive when you are no  longer taking prescription pain medication, you can comfortably wear a seatbelt, and you can safely maneuver your car and apply brakes. You may have sexual intercourse when it is comfortable.  FOLLOW UP in our office Please call [facility] at [phone] to set up an appointment to see your surgeon in the office for a follow-up appointment approximately 1-2 weeks after your surgery. Make sure that you call for this appointment the day you arrive home to insure a convenient appointment time. 10. IF YOU HAVE DISABILITY OR FAMILY LEAVE FORMS, BRING THEM TO THE OFFICE FOR PROCESSING.  DO NOT GIVE THEM TO YOUR DOCTOR.   WHEN TO CALL us [phone]: Poor pain control Reactions / problems with new medications (rash/itching, nausea, etc)  Fever over 101.5 F (38.5 C) Inability to urinate Nausea and/or vomiting Worsening swelling or bruising Continued bleeding from incision. Increased pain, redness, or drainage from the incision  The clinic staff is available to answer your questions during regular business hours (8:30am-5pm).  Please don't hesitate to call and ask to speak to one of our nurses for clinical concerns.   A surgeon from [facility] is always on call at the hospitals   If you have a medical emergency, go to the nearest emergency room or call 911.    [facility] [address], [address], [place], [place]  [address] ? MAIN: [phone]) [phone] ? TOLL FREE: [phone] ? FAX [phone] [email or web address]

## 2023-03-14 NOTE — Plan of Care (Signed)
  Problem: Activity: Goal: Ability to tolerate increased activity will improve Outcome: Adequate for Discharge   Problem: Nutrition: Goal: Adequate nutrition will be maintained Outcome: Adequate for Discharge

## 2023-05-24 ENCOUNTER — Emergency Department (HOSPITAL_BASED_OUTPATIENT_CLINIC_OR_DEPARTMENT_OTHER)
Admission: EM | Admit: 2023-05-24 | Discharge: 2023-05-24 | Disposition: A | Discharge status: Home / Self Care | Attending: Emergency Medicine | Admitting: Emergency Medicine

## 2023-05-24 ENCOUNTER — Encounter (HOSPITAL_BASED_OUTPATIENT_CLINIC_OR_DEPARTMENT_OTHER): Payer: Self-pay | Admitting: Emergency Medicine

## 2023-05-24 ENCOUNTER — Other Ambulatory Visit: Payer: Self-pay

## 2023-05-24 DIAGNOSIS — L03012 Cellulitis of left finger: Secondary | ICD-10-CM | POA: Diagnosis not present

## 2023-05-24 DIAGNOSIS — Z7982 Long term (current) use of aspirin: Secondary | ICD-10-CM | POA: Insufficient documentation

## 2023-05-24 DIAGNOSIS — M79642 Pain in left hand: Secondary | ICD-10-CM | POA: Diagnosis present

## 2023-05-24 DIAGNOSIS — Z23 Encounter for immunization: Secondary | ICD-10-CM | POA: Insufficient documentation

## 2023-05-24 MED ORDER — DOXYCYCLINE HYCLATE 100 MG PO CAPS: 100.0000 mg | ORAL_CAPSULE | Freq: Two times a day (BID) | ORAL | 0 refills | Status: AC

## 2023-05-24 MED ORDER — TETANUS-DIPHTH-ACELL PERTUSSIS 5-2.5-18.5 LF-MCG/0.5 IM SUSY
0.5000 mL | PREFILLED_SYRINGE | Freq: Once | INTRAMUSCULAR | Status: AC
  Administered 2023-05-24: 0.5 mL via INTRAMUSCULAR
  Filled 2023-05-24: qty 0.5

## 2023-05-24 MED ORDER — ACETAMINOPHEN 500 MG PO TABS: 500.0000 mg | ORAL_TABLET | Freq: Four times a day (QID) | ORAL | 0 refills | Status: AC | PRN

## 2023-05-24 NOTE — ED Provider Notes (Signed)
  EMERGENCY DEPARTMENT AT [facility] Provider Note   CSN: [id number] Arrival date & time: 05/24/23  1327     History  Chief Complaint  Patient presents with   Hand Pain    Roger Mcdonald is a 73 y.o. male.  The history is provided by the patient and medical records. No language interpreter was used.  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  Patient noticed pain swelling redness involving the edge of cuticle fold of his left ring finger since yesterday.  Pain is throbbing, moderate in severity no associated fever no chills no injury.  He is unsure his last tetanus status.  Denies any specific treatment tried.  Home Medications Prior to Admission medications   Medication Sig Start Date End Date Taking? Authorizing Provider  aspirin EC 325 MG tablet Take 325-650 mg by mouth every 8 (eight) hours as needed (pain.).    [provider]  calcium  carbonate (TUMS EX) 750 MG chewable tablet Chew 1-2 tablets by mouth 3 (three) times daily as needed for heartburn.    [provider]  oxyCODONE  (OXY IR/ROXICODONE ) 5 MG immediate release tablet Take 1-2 tablets (5-10 mg total) by mouth every 6 (six) hours as needed (pain). 03/14/23   Joyce Nixon, MD      Allergies    Patient has no known allergies.    Review of Systems   Review of Systems  Constitutional:  Negative for fever.  Skin:  Positive for wound.    Physical Exam Updated Vital Signs BP 122/61 (BP Location: Right Arm)   Pulse 61   Temp 97.7 F (36.5 C)   Resp 16   Ht 5\' 6"  (1.676 m)   Wt 97.1 kg   SpO2 100%   BMI 34.55 kg/m  Physical Exam Constitutional:      General: He is not in acute distress.    Appearance: He is well-developed.  HENT:     Head: Atraumatic.  Eyes:     Conjunctiva/sclera: Conjunctivae normal.  Musculoskeletal:        General: Tenderness (Left ring finger: Erythema and small skin disruption  noted to the lateral cuticle nail fold with tenderness to palpation but no purulent discharge and no nail involvement.) present.     Cervical back: Normal range of motion and neck supple.  Skin:    Findings: No rash.  Neurological:     Mental Status: He is alert.     ED Results / Procedures / Treatments   Labs (all labs ordered are listed, but only abnormal results are displayed) Labs Reviewed - No data to display  EKG None  Radiology No results found.  Procedures Procedures    Medications Ordered in ED Medications  Tdap (BOOSTRIX) injection 0.5 mL (has no administration in time range)    ED Course/ Medical Decision Making/ A&P                                 Medical Decision Making  BP 122/61 (BP Location: Right Arm)   Pulse 61   Temp 97.7 F (36.5 C)   Resp 16   Ht 5\' 6"  (1.676 m)   Wt 97.1 kg   SpO2 100%   BMI 34.55 kg/m   51:29 PM   73 year old male history of diverticular disease presenting complaining of finger pain.  Patient  noticed pain swelling redness involving the edge of cuticle fold of his left ring finger since yesterday.  Pain is throbbing, moderate in severity no associated fever no chills no injury.  He is unsure his last tetanus status.  Denies any specific treatment tried.  Exam notable for signs of early paronychia involving the left ring finger.  No nail involvement.  No pocket of abscess.  I suspect patient may have tugged on a hangnail causing his infection.  I recommend soak in warm compress, will discharge home with doxycycline antibiotic and Tylenol  for wound infection.  Return precaution discussed.  Low suspicion for felon.        Final Clinical Impression(s) / ED Diagnoses Final diagnoses:  Paronychia of finger of left hand    Rx / DC Orders ED Discharge Orders          Ordered    doxycycline (VIBRAMYCIN) 100 MG capsule  2 times daily        05/24/23 1413    acetaminophen  (TYLENOL ) 500 MG tablet  Every 6 hours PRN         05/24/23 1413              Debbra Fairy, PA-C 05/24/23 1416    Mozell Arias, MD 05/24/23 1435

## 2023-05-24 NOTE — Discharge Instructions (Addendum)
 You have an infection at the cuticle nail fold on the left ring finger.  Please soak finger in warm salty water several times daily to aid with healing.  Take antibiotic as prescribed.  You may take Tylenol  as needed for pain.  Return to ER if you notice no improvement after taking antibiotic for 2 to 3 days.

## 2023-05-24 NOTE — ED Triage Notes (Signed)
 Pt with redness, swelling and drainage to LT ring finger since Fri

## 2023-05-25 ENCOUNTER — Emergency Department (HOSPITAL_BASED_OUTPATIENT_CLINIC_OR_DEPARTMENT_OTHER)
Admission: EM | Admit: 2023-05-25 | Discharge: 2023-05-25 | Disposition: A | Discharge status: Home / Self Care | Attending: Emergency Medicine | Admitting: Emergency Medicine

## 2023-05-25 ENCOUNTER — Emergency Department (HOSPITAL_BASED_OUTPATIENT_CLINIC_OR_DEPARTMENT_OTHER)

## 2023-05-25 ENCOUNTER — Other Ambulatory Visit: Payer: Self-pay

## 2023-05-25 ENCOUNTER — Encounter (HOSPITAL_BASED_OUTPATIENT_CLINIC_OR_DEPARTMENT_OTHER): Payer: Self-pay

## 2023-05-25 DIAGNOSIS — Z7982 Long term (current) use of aspirin: Secondary | ICD-10-CM | POA: Insufficient documentation

## 2023-05-25 DIAGNOSIS — M7062 Trochanteric bursitis, left hip: Secondary | ICD-10-CM | POA: Insufficient documentation

## 2023-05-25 DIAGNOSIS — Y9355 Activity, bike riding: Secondary | ICD-10-CM | POA: Insufficient documentation

## 2023-05-25 DIAGNOSIS — M25552 Pain in left hip: Secondary | ICD-10-CM | POA: Diagnosis present

## 2023-05-25 MED ORDER — KETOROLAC TROMETHAMINE 30 MG/ML IJ SOLN
30.0000 mg | Freq: Once | INTRAMUSCULAR | Status: AC
  Administered 2023-05-25: 30 mg via INTRAMUSCULAR
  Filled 2023-05-25: qty 1

## 2023-05-25 MED ORDER — LIDOCAINE 5 % EX PTCH
1.0000 | MEDICATED_PATCH | CUTANEOUS | Status: DC
  Administered 2023-05-25: 1 via TRANSDERMAL
  Filled 2023-05-25: qty 1

## 2023-05-25 NOTE — ED Triage Notes (Signed)
 Pt arrives ambulatory to ED with c/o pain to left hip states that pain kept him up most of the night. Pt did take aspirin last night around 8 pm. Denies any injury.

## 2023-05-25 NOTE — ED Provider Notes (Signed)
 [place] EMERGENCY DEPARTMENT AT [facility] Provider Note   CSN: [id number] Arrival date & time: 05/25/23  1210     History  Chief Complaint  Patient presents with   Hip Pain    Roger Mcdonald is a 73 y.o. male.  With a history of arthritis who presents to ED for left hip pain.  Patient first experienced pain in his left hip last night and made it difficult to sleep.  He took aspirin last night but no other medications.  No inciting injury trauma or falls.  No fevers chills or recent illness.  Has never experienced left hip pain like this before.  He does frequently ride a motorcycle but has not been on any long rides recently.  No numbness, tingling or back pain   Hip Pain       Home Medications Prior to Admission medications   Medication Sig Start Date End Date Taking? Authorizing Provider  acetaminophen  (TYLENOL ) 500 MG tablet Take 1 tablet (500 mg total) by mouth every 6 (six) hours as needed. 05/24/23   Debbra Fairy, PA-C  aspirin EC 325 MG tablet Take 325-650 mg by mouth every 8 (eight) hours as needed (pain.).    [provider]  calcium  carbonate (TUMS EX) 750 MG chewable tablet Chew 1-2 tablets by mouth 3 (three) times daily as needed for heartburn.    [provider]  doxycycline (VIBRAMYCIN) 100 MG capsule Take 1 capsule (100 mg total) by mouth 2 (two) times daily. 05/24/23   Debbra Fairy, PA-C  oxyCODONE  (OXY IR/ROXICODONE ) 5 MG immediate release tablet Take 1-2 tablets (5-10 mg total) by mouth every 6 (six) hours as needed (pain). 03/14/23   Joyce Nixon, MD      Allergies    Patient has no known allergies.    Review of Systems   Review of Systems  Physical Exam Updated Vital Signs BP 132/74 (BP Location: Right Arm)   Pulse 79   Temp 98.2 F (36.8 C) (Oral)   Resp 18   Ht 5\' 6"  (1.676 m)   Wt 97.1 kg   SpO2 98%   BMI 34.54 kg/m  Physical Exam Vitals and nursing note reviewed.  HENT:     Head: Normocephalic and atraumatic.   Eyes:     Pupils: Pupils are equal, round, and reactive to light.  Cardiovascular:     Rate and Rhythm: Normal rate and regular rhythm.  Pulmonary:     Effort: Pulmonary effort is normal.     Breath sounds: Normal breath sounds.  Abdominal:     Palpations: Abdomen is soft.     Tenderness: There is no abdominal tenderness.  Musculoskeletal:     Comments: Point tenderness over left greater trochanteric region 5 out of 5 motor strength bilateral lower extremities Sensation tact light touch throughout bilateral lower extremities 2+ DP pulses bilaterally  Skin:    General: Skin is warm and dry.  Neurological:     Mental Status: He is alert.  Psychiatric:        Mood and Affect: Mood normal.     ED Results / Procedures / Treatments   Labs (all labs ordered are listed, but only abnormal results are displayed) Labs Reviewed - No data to display  EKG None  Radiology DG Pelvis Portable Result Date: 05/25/2023 CLINICAL DATA:  Left hip pain. EXAM: PORTABLE PELVIS 1-2 VIEWS COMPARISON:  PET CT 04/29/2023 reviewed FINDINGS: The cortical margins of the bony pelvis are intact. No fracture. Pubic  symphysis and sacroiliac joints are congruent. Mild bilateral acetabular spurring. Both femoral heads are well-seated in the respective acetabula. No evidence of focal bone lesion. IMPRESSION: Mild bilateral hip osteoarthritis. Electronically Signed   By: Chadwick Colonel M.D.   On: 05/25/2023 14:49    Procedures Procedures    Medications Ordered in ED Medications  lidocaine  (LIDODERM ) 5 % 1 patch (1 patch Transdermal Patch Applied 05/25/23 1328)  ketorolac (TORADOL) 30 MG/ML injection 30 mg (30 mg Intramuscular Given 05/25/23 1325)    ED Course/ Medical Decision Making/ A&P Clinical Course as of 05/25/23 1458  Mon May 25, 2023  1457 X-ray shows some mild bilateral hip osteoarthritis but no other acute osseous abnormalities.  High suspicion for trochanteric bursitis.  Patient reports some  improvement after Toradol here.  Instructed him on symptomatic management with NSAIDs at home.  Stable for discharge.  Return precautions will be worrisome for severe pain and inability to walk were discussed [MP]    Clinical Course User Index [MP] Sallyanne Creamer, DO                                 Medical Decision Making 73 year old male with history as above presenting for atraumatic left hip pain.  Exam notable for point tenderness over left greater trochanter.  No sensory or motor deficits.  No back pain.  No skin changes or be worrisome for acute infectious process.  Differential diagnosis includes trochanteric bursitis versus left sciatica versus osseous injury.  Will obtain x-ray of the left hip to look for osseous injury although low suspicion.  Will treat with Toradol and Tylenol  and reassess  Amount and/or Complexity of Data Reviewed Radiology: ordered.  Risk Prescription drug management.           Final Clinical Impression(s) / ED Diagnoses Final diagnoses:  Trochanteric bursitis of left hip    Rx / DC Orders ED Discharge Orders     None         Sallyanne Creamer, DO 05/25/23 1459

## 2023-05-25 NOTE — Discharge Instructions (Signed)
 He was seen in the emerged from for left hip pain Your x-ray looked okay This is most likely something called trochanteric bursitis which is inflammation of the hip that is causing her pain Take ibuprofen as directed to relieve your pain at home Return to the emergency department if you have severe pain despite ibuprofen or you cannot walk Otherwise follow-up with your primary care doctor in 1 week for reevaluation

## 2023-12-22 ENCOUNTER — Telehealth: Payer: Self-pay | Admitting: Gastroenterology

## 2023-12-22 NOTE — Telephone Encounter (Signed)
 Patient returning call Requesting a call back  Please advise  Thank you

## 2023-12-22 NOTE — Telephone Encounter (Signed)
 Left message for pt to call back

## 2023-12-22 NOTE — Telephone Encounter (Signed)
 Inbound call from patient stating he would like to speak to nurse in regards to having some abdominal discomfort and would like to be advised on what to do  Requesting a call back  Please advise  Thank you

## 2023-12-23 NOTE — Telephone Encounter (Signed)
 Left message for pt to call back

## 2023-12-28 NOTE — Telephone Encounter (Signed)
 Left additional message for patient to callback.

## 2023-12-29 NOTE — Telephone Encounter (Signed)
 Following 2 attempts to reach patient again by phone, no return call has been received. We will await further correspondence from patient before continuing additional communication regarding his concern.
